# Patient Record
Sex: Female | Born: 1980 | Hispanic: Yes | Marital: Married | Smoking: Never smoker
Health system: Southern US, Community
[De-identification: ages and names within clinical notes are randomized; demographics above are authoritative.]

## PROBLEM LIST (undated history)

## (undated) DIAGNOSIS — R7989 Other specified abnormal findings of blood chemistry: Secondary | ICD-10-CM

## (undated) DIAGNOSIS — O24419 Gestational diabetes mellitus in pregnancy, unspecified control: Secondary | ICD-10-CM

## (undated) DIAGNOSIS — R7309 Other abnormal glucose: Secondary | ICD-10-CM

## (undated) DIAGNOSIS — Z789 Other specified health status: Secondary | ICD-10-CM

## (undated) DIAGNOSIS — O139 Gestational [pregnancy-induced] hypertension without significant proteinuria, unspecified trimester: Secondary | ICD-10-CM

## (undated) DIAGNOSIS — G43109 Migraine with aura, not intractable, without status migrainosus: Secondary | ICD-10-CM

## (undated) DIAGNOSIS — Z8759 Personal history of other complications of pregnancy, childbirth and the puerperium: Secondary | ICD-10-CM

## (undated) DIAGNOSIS — B019 Varicella without complication: Secondary | ICD-10-CM

## (undated) HISTORY — DX: Gestational (pregnancy-induced) hypertension without significant proteinuria, unspecified trimester: O13.9

## (undated) HISTORY — DX: Varicella without complication: B01.9

## (undated) HISTORY — DX: Other specified abnormal findings of blood chemistry: R79.89

## (undated) HISTORY — DX: Other abnormal glucose: R73.09

## (undated) HISTORY — PX: INTRAUTERINE DEVICE INSERTION: SHX323

## (undated) HISTORY — DX: Migraine with aura, not intractable, without status migrainosus: G43.109

## (undated) HISTORY — PX: BREAST ENHANCEMENT SURGERY: SHX7

---

## 2010-04-23 HISTORY — PX: COSMETIC SURGERY: SHX468

## 2010-04-23 HISTORY — PX: AUGMENTATION MAMMAPLASTY: SUR837

## 2013-10-15 LAB — OB RESULTS CONSOLE ABO/RH: RH Type: POSITIVE

## 2013-10-15 LAB — OB RESULTS CONSOLE RUBELLA ANTIBODY, IGM: Rubella: IMMUNE

## 2013-10-15 LAB — OB RESULTS CONSOLE HIV ANTIBODY (ROUTINE TESTING): HIV: NONREACTIVE

## 2013-10-15 LAB — OB RESULTS CONSOLE GC/CHLAMYDIA
Chlamydia: NEGATIVE
Gonorrhea: NEGATIVE

## 2013-10-15 LAB — OB RESULTS CONSOLE RPR: RPR: NONREACTIVE

## 2013-10-15 LAB — OB RESULTS CONSOLE HEPATITIS B SURFACE ANTIGEN: Hepatitis B Surface Ag: NEGATIVE

## 2013-10-15 LAB — OB RESULTS CONSOLE ANTIBODY SCREEN: Antibody Screen: NEGATIVE

## 2014-01-05 ENCOUNTER — Inpatient Hospital Stay (HOSPITAL_COMMUNITY): Admission: AD | Admit: 2014-01-05 | Payer: Self-pay | Source: Ambulatory Visit | Admitting: Obstetrics & Gynecology

## 2014-03-10 ENCOUNTER — Encounter: Payer: BC Managed Care – PPO | Attending: Obstetrics & Gynecology

## 2014-03-10 VITALS — Ht 63.5 in | Wt 168.6 lb

## 2014-03-10 DIAGNOSIS — Z713 Dietary counseling and surveillance: Secondary | ICD-10-CM | POA: Insufficient documentation

## 2014-03-10 DIAGNOSIS — O24419 Gestational diabetes mellitus in pregnancy, unspecified control: Secondary | ICD-10-CM

## 2014-03-10 NOTE — Progress Notes (Signed)
  Patient was seen on 03/10/14 for Gestational Diabetes self-management . The following learning objectives were met by the patient :   States the definition of Gestational Diabetes  States why dietary management is important in controlling blood glucose  Describes the effects of carbohydrates on blood glucose levels  Demonstrates ability to create a balanced meal plan  Demonstrates carbohydrate counting   States when to check blood glucose levels  Demonstrates proper blood glucose monitoring techniques  States the effect of stress and exercise on blood glucose levels  States the importance of limiting caffeine and abstaining from alcohol and smoking  Plan:  Aim for 2 Carb Choices per meal (30 grams) +/- 1 either way for breakfast Aim for 3 Carb Choices per meal (45 grams) +/- 1 either way from lunch and dinner Aim for 1-2 Carbs per snack Begin reading food labels for Total Carbohydrate and sugar grams of foods Consider  increasing your activity level by walking daily as tolerated Begin checking BG before breakfast and 2 hours after first bit of breakfast, lunch and dinner after  as directed by MD  Take medication  as directed by MD  Blood glucose monitor given: One Touch Ultra 2 Lot # [id number] X Exp: 08/2014 Blood glucose reading: $RemoveBeforeDE'76mg'mPHxirzHvexHZHU$ /dl  Patient instructed to monitor glucose levels: FBS: 60 - <90 2 hour: <120  Patient received the following handouts:  Nutrition Diabetes and Pregnancy  Carbohydrate Counting List  Meal Planning worksheet  Patient will be seen for follow-up as needed.

## 2014-04-12 LAB — OB RESULTS CONSOLE GBS: STREP GROUP B AG: NEGATIVE

## 2014-04-23 HISTORY — DX: Maternal care for unspecified type scar from previous cesarean delivery: O34.219

## 2014-04-23 NOTE — L&D Delivery Note (Signed)
Operative Delivery Note: At 10:20 PM a healthy female was delivered via VBAC, Spontaneous.  Presentation: vertex; Position: Occiput,; Station: +3.  Verbal consent: obtained from patient.  Risks and benefits discussed in detail.  Risks include, but are not limited to the risks of anesthesia, bleeding, infection, damage to maternal tissues, fetal cephalhematoma.  There is also the risk of inability to effect vaginal delivery of the head, or shoulder dystocia that cannot be resolved by established maneuvers, leading to the need for emergency cesarean section.  Easy application of Bell shape Vacuum.  Tractions in safety zone over 4 UCs.  Presentation at vulva, but poor adherence of vacuum.  Expulsive efforts of mother efficient without vacuum at that point and FHR monitoring with good base line with only mild variable decelerations.  SVD.  No shoulder dystocia.  Cord milking x 2.  Cord clamped and cut promptly.  Neonatal team called promptly.  APGAR: 1, 6, 8; weight 6 lb 11.1 oz (3036 g).   Placenta status: Intact, Spontaneous.   Cord: 3 vessels with the following complications: None.  Cord pH: 7.01  Anesthesia: Epidural  Instruments: Bell shape vacuum Episiotomy: None Lacerations: 2nd degree;Perineal Suture Repair: 2.0 3.0 vicryl Est. Blood Loss (mL):  350   Mom to postpartum.  Baby to Couplet care / Skin to Skin. PEC on MgSO4.  Transfer to AICU.   Dorothey Oetken,MARIE-LYNE 05/07/2014, 10:58 PM

## 2014-05-06 ENCOUNTER — Other Ambulatory Visit: Payer: Self-pay | Admitting: Obstetrics & Gynecology

## 2014-05-07 ENCOUNTER — Inpatient Hospital Stay (HOSPITAL_COMMUNITY): Payer: BLUE CROSS/BLUE SHIELD | Admitting: Anesthesiology

## 2014-05-07 ENCOUNTER — Encounter (HOSPITAL_COMMUNITY): Payer: Self-pay

## 2014-05-07 ENCOUNTER — Inpatient Hospital Stay (HOSPITAL_COMMUNITY)
Admission: AD | Admit: 2014-05-07 | Discharge: 2014-05-10 | DRG: 775 | Disposition: A | Discharge status: Home / Self Care | Payer: BLUE CROSS/BLUE SHIELD | Source: Ambulatory Visit | Attending: Obstetrics & Gynecology | Admitting: Obstetrics & Gynecology

## 2014-05-07 DIAGNOSIS — O133 Gestational [pregnancy-induced] hypertension without significant proteinuria, third trimester: Secondary | ICD-10-CM | POA: Diagnosis present

## 2014-05-07 DIAGNOSIS — O9902 Anemia complicating childbirth: Secondary | ICD-10-CM | POA: Diagnosis present

## 2014-05-07 DIAGNOSIS — Z3A38 38 weeks gestation of pregnancy: Secondary | ICD-10-CM | POA: Diagnosis present

## 2014-05-07 DIAGNOSIS — D509 Iron deficiency anemia, unspecified: Secondary | ICD-10-CM | POA: Diagnosis present

## 2014-05-07 DIAGNOSIS — O34219 Maternal care for unspecified type scar from previous cesarean delivery: Secondary | ICD-10-CM | POA: Diagnosis not present

## 2014-05-07 DIAGNOSIS — D62 Acute posthemorrhagic anemia: Secondary | ICD-10-CM | POA: Diagnosis not present

## 2014-05-07 DIAGNOSIS — Z3483 Encounter for supervision of other normal pregnancy, third trimester: Secondary | ICD-10-CM | POA: Diagnosis present

## 2014-05-07 DIAGNOSIS — R609 Edema, unspecified: Secondary | ICD-10-CM | POA: Diagnosis present

## 2014-05-07 DIAGNOSIS — Z8759 Personal history of other complications of pregnancy, childbirth and the puerperium: Secondary | ICD-10-CM

## 2014-05-07 HISTORY — DX: Gestational diabetes mellitus in pregnancy, unspecified control: O24.419

## 2014-05-07 HISTORY — DX: Personal history of other complications of pregnancy, childbirth and the puerperium: Z87.59

## 2014-05-07 HISTORY — DX: Other specified health status: Z78.9

## 2014-05-07 HISTORY — DX: Gestational (pregnancy-induced) hypertension without significant proteinuria, third trimester: O13.3

## 2014-05-07 LAB — TYPE AND SCREEN
ABO/RH(D): A POS
Antibody Screen: NEGATIVE

## 2014-05-07 LAB — CBC
HCT: 35 % — ABNORMAL LOW (ref 36.0–46.0)
HEMATOCRIT: 34.5 % — AB (ref 36.0–46.0)
Hemoglobin: 11.7 g/dL — ABNORMAL LOW (ref 12.0–15.0)
Hemoglobin: 11.6 g/dL — ABNORMAL LOW (ref 12.0–15.0)
MCH: 31 pg (ref 26.0–34.0)
MCH: 31.1 pg (ref 26.0–34.0)
MCHC: 33.4 g/dL (ref 30.0–36.0)
MCHC: 33.6 g/dL (ref 30.0–36.0)
MCV: 92.8 fL (ref 78.0–100.0)
MCV: 92.5 fL (ref 78.0–100.0)
PLATELETS: 191 10*3/uL (ref 150–400)
PLATELETS: 171 10*3/uL (ref 150–400)
RBC: 3.77 MIL/uL — AB (ref 3.87–5.11)
RBC: 3.73 MIL/uL — ABNORMAL LOW (ref 3.87–5.11)
RDW: 14.3 % (ref 11.5–15.5)
RDW: 14.1 % (ref 11.5–15.5)
WBC: 7.1 10*3/uL (ref 4.0–10.5)
WBC: 14.3 10*3/uL — ABNORMAL HIGH (ref 4.0–10.5)

## 2014-05-07 LAB — COMPREHENSIVE METABOLIC PANEL
ALK PHOS: 133 U/L — AB (ref 39–117)
ALT: 15 U/L (ref 0–35)
ANION GAP: 7 (ref 5–15)
AST: 26 U/L (ref 0–37)
Albumin: 2.6 g/dL — ABNORMAL LOW (ref 3.5–5.2)
BILIRUBIN TOTAL: 0.2 mg/dL — AB (ref 0.3–1.2)
BUN: 10 mg/dL (ref 6–23)
CO2: 21 mmol/L (ref 19–32)
Calcium: 9 mg/dL (ref 8.4–10.5)
Chloride: 108 mEq/L (ref 96–112)
Creatinine, Ser: 0.55 mg/dL (ref 0.50–1.10)
GFR calc Af Amer: >90 mL/min (ref >90)
GFR calc non Af Amer: >90 mL/min (ref >90)
GLUCOSE: 84 mg/dL (ref 70–99)
POTASSIUM: 3.8 mmol/L (ref 3.5–5.1)
Sodium: 136 mmol/L (ref 135–145)
TOTAL PROTEIN: 5.2 g/dL — AB (ref 6.0–8.3)

## 2014-05-07 LAB — LACTATE DEHYDROGENASE: LDH: 174 U/L (ref 94–250)

## 2014-05-07 LAB — PROTEIN / CREATININE RATIO, URINE
CREATININE, URINE: 114 mg/dL
PROTEIN CREATININE RATIO: 1.28 — AB (ref 0.00–0.15)
TOTAL PROTEIN, URINE: 146 mg/dL

## 2014-05-07 LAB — ABO/RH: ABO/RH(D): A POS

## 2014-05-07 MED ORDER — CITRIC ACID-SODIUM CITRATE 334-500 MG/5ML PO SOLN
30.0000 mL | ORAL | Status: DC | PRN
  Administered 2014-05-07: 30 mL via ORAL
  Filled 2014-05-07 (×2): qty 15

## 2014-05-07 MED ORDER — EPHEDRINE 5 MG/ML INJ
10.0000 mg | INTRAVENOUS | Status: DC | PRN
  Filled 2014-05-07: qty 2

## 2014-05-07 MED ORDER — PHENYLEPHRINE 40 MCG/ML (10ML) SYRINGE FOR IV PUSH (FOR BLOOD PRESSURE SUPPORT)
80.0000 ug | PREFILLED_SYRINGE | INTRAVENOUS | Status: DC | PRN
  Filled 2014-05-07: qty 2

## 2014-05-07 MED ORDER — LACTATED RINGERS IV SOLN: 500.0000 mL | INTRAVENOUS | Status: DC | PRN

## 2014-05-07 MED ORDER — TERBUTALINE SULFATE 1 MG/ML IJ SOLN
0.2500 mg | Freq: Once | INTRAMUSCULAR | Status: AC | PRN
Start: 2014-05-07 — End: 2014-05-07

## 2014-05-07 MED ORDER — OXYCODONE-ACETAMINOPHEN 5-325 MG PO TABS: 1.0000 | ORAL_TABLET | ORAL | Status: DC | PRN

## 2014-05-07 MED ORDER — MAGNESIUM SULFATE 40 G IN LACTATED RINGERS - SIMPLE
2.0000 g/h | INTRAVENOUS | Status: DC
  Administered 2014-05-07 – 2014-05-08 (×2): 2 g/h via INTRAVENOUS
  Filled 2014-05-07 (×2): qty 500

## 2014-05-07 MED ORDER — FENTANYL 2.5 MCG/ML BUPIVACAINE 1/10 % EPIDURAL INFUSION (WH - ANES)
INTRAMUSCULAR | Status: AC
  Administered 2014-05-07: 14 mL/h via EPIDURAL
  Filled 2014-05-07: qty 125

## 2014-05-07 MED ORDER — FENTANYL 2.5 MCG/ML BUPIVACAINE 1/10 % EPIDURAL INFUSION (WH - ANES)
14.0000 mL/h | INTRAMUSCULAR | Status: DC | PRN
  Administered 2014-05-07: 14 mL/h via EPIDURAL
  Filled 2014-05-07 (×2): qty 125

## 2014-05-07 MED ORDER — OXYCODONE-ACETAMINOPHEN 5-325 MG PO TABS: 2.0000 | ORAL_TABLET | ORAL | Status: DC | PRN

## 2014-05-07 MED ORDER — ONDANSETRON HCL 4 MG/2ML IJ SOLN: 4.0000 mg | Freq: Four times a day (QID) | INTRAMUSCULAR | Status: DC | PRN

## 2014-05-07 MED ORDER — ACETAMINOPHEN 325 MG PO TABS: 650.0000 mg | ORAL_TABLET | ORAL | Status: DC | PRN

## 2014-05-07 MED ORDER — MAGNESIUM SULFATE BOLUS VIA INFUSION
4.0000 g | Freq: Once | INTRAVENOUS | Status: AC
  Administered 2014-05-07: 4 g via INTRAVENOUS
  Filled 2014-05-07: qty 500

## 2014-05-07 MED ORDER — LACTATED RINGERS IV SOLN: 500.0000 mL | Freq: Once | INTRAVENOUS | Status: DC

## 2014-05-07 MED ORDER — OXYCODONE-ACETAMINOPHEN 5-325 MG PO TABS
2.0000 | ORAL_TABLET | ORAL | Status: DC | PRN
  Filled 2014-05-07: qty 2

## 2014-05-07 MED ORDER — PHENYLEPHRINE 40 MCG/ML (10ML) SYRINGE FOR IV PUSH (FOR BLOOD PRESSURE SUPPORT)
PREFILLED_SYRINGE | INTRAVENOUS | Status: AC
  Filled 2014-05-07: qty 20

## 2014-05-07 MED ORDER — OXYTOCIN BOLUS FROM INFUSION: 500.0000 mL | INTRAVENOUS | Status: DC

## 2014-05-07 MED ORDER — LIDOCAINE HCL (PF) 1 % IJ SOLN
INTRAMUSCULAR | Status: DC | PRN
  Administered 2014-05-07 (×2): 8 mL

## 2014-05-07 MED ORDER — FENTANYL 2.5 MCG/ML BUPIVACAINE 1/10 % EPIDURAL INFUSION (WH - ANES)
INTRAMUSCULAR | Status: DC | PRN
  Administered 2014-05-07: 14 mL/h via EPIDURAL

## 2014-05-07 MED ORDER — LACTATED RINGERS IV SOLN
INTRAVENOUS | Status: DC
  Administered 2014-05-07 – 2014-05-08 (×2): via INTRAVENOUS

## 2014-05-07 MED ORDER — OXYTOCIN 40 UNITS IN LACTATED RINGERS INFUSION - SIMPLE MED
1.0000 m[IU]/min | INTRAVENOUS | Status: DC
  Administered 2014-05-07: 1 m[IU]/min via INTRAVENOUS
  Filled 2014-05-07: qty 1000

## 2014-05-07 MED ORDER — IBUPROFEN 600 MG PO TABS
600.0000 mg | ORAL_TABLET | Freq: Four times a day (QID) | ORAL | Status: DC
  Administered 2014-05-08 – 2014-05-10 (×11): 600 mg via ORAL
  Filled 2014-05-07 (×11): qty 1

## 2014-05-07 MED ORDER — OXYTOCIN 40 UNITS IN LACTATED RINGERS INFUSION - SIMPLE MED: 62.5000 mL/h | INTRAVENOUS | Status: DC

## 2014-05-07 MED ORDER — DIPHENHYDRAMINE HCL 50 MG/ML IJ SOLN: 12.5000 mg | INTRAMUSCULAR | Status: DC | PRN

## 2014-05-07 MED ORDER — LIDOCAINE HCL (PF) 1 % IJ SOLN
30.0000 mL | INTRAMUSCULAR | Status: DC | PRN
  Administered 2014-05-07: 30 mL via SUBCUTANEOUS
  Filled 2014-05-07: qty 30

## 2014-05-07 NOTE — Progress Notes (Signed)
Dr.Lavoie at bedside--AROM performed, pt status post epidural and comfortable--TOLAC consent signed, Mag orders given

## 2014-05-07 NOTE — H&P (Signed)
Christina Black is a 34 y.o. female G3P0010 [id number] presenting for Induction PEC.  [place] in [place]  Op report confirmed LT C/S. GDMA1 well controled on diet. EFW 8+ Lbs per last US.  OB History    Gravida Para Term Preterm AB TAB SAB Ectopic Multiple Living   3 1 0 0 1 0 1 0 0 0      Past Medical History  Diagnosis Date  . Medical history non-contributory   . Gestational diabetes    Past Surgical History  Procedure Laterality Date  . Breast enhancement surgery     Family History: family history is not on file. Social History:  has no tobacco, alcohol, and drug history on file.  Allergies  Allergen Reactions  . Cefazolin Anaphylaxis and Hives    Dilation: 4 Effacement (%): 90 Station: -2 Exam by:: Dr.Jamilee Lafosse  AROM AF clear +++  Blood pressure 145/90, pulse 73, temperature 97.7 F (36.5 C), temperature source Oral, resp. rate 16, height 5\' 7"  (1.702 m), weight 190 lb (86.183 kg), SpO2 100 %. Exam Physical Exam   FHR 140's accelerations present.  Good variability, no deceleration. UCs q3 min increasing on Pitocin.  HPP:  Patient Active Problem List   Diagnosis Date Noted  . Gestational hypertension without significant proteinuria in third trimester 05/07/2014    Prenatal labs: ABO, Rh: --/--/A POS (01/15 1035) Antibody: NEG (01/15 1035) Rubella:  Immune RPR: Nonreactive (06/25 0000)  HBsAg: Negative (06/25 0000)  HIV: Non-reactive (06/25 0000)  Genetic testing:  Ultrascreen neg.  AFP1 neg. US anato: wnl 1 hr GTT/3 hr GTT abnormal.  GDMA1  GBS: Negative (12/21 0000)   Assessment/Plan: 38 5/7 wks with PEC based on PIH/severe Prtnuria.  Labs wnl.  Induction Pito/AROM.  MgSO4.  Expectant management towards vaginal delivery.  TOLAC.  Epidural.     Christina Black,MARIE-LYNE 05/07/2014, 3:28 PM

## 2014-05-07 NOTE — Progress Notes (Signed)
Dr Billy Coastaavon notified of patient admission status with history of CS, lack of TOLAC consent, GDM, increased blood pressures and report of marginal cord insertion. Discussed POC with provider, SVE, FHr tracing, UCs, and need for signature on consent. Orders for pitocin.

## 2014-05-07 NOTE — Anesthesia Preprocedure Evaluation (Addendum)
Anesthesia Evaluation  Patient identified by MRN, date of birth, ID band Patient awake    Reviewed: Allergy & Precautions, H&P , NPO status , Patient's Chart, lab work & pertinent test results  Airway Mallampati: I  TM Distance: >3 FB Neck ROM: full    Dental no notable dental hx.    Pulmonary neg pulmonary ROS,    Pulmonary exam normal       Cardiovascular hypertension, Pt. on medications     Neuro/Psych negative neurological ROS  negative psych ROS   GI/Hepatic negative GI ROS, Neg liver ROS,   Endo/Other  diabetes, Gestational  Renal/GU negative Renal ROS     Musculoskeletal   Abdominal Normal abdominal exam  (+)   Peds  Hematology negative hematology ROS (+)   Anesthesia Other Findings   Reproductive/Obstetrics (+) Pregnancy                            Anesthesia Physical Anesthesia Plan  ASA: III  Anesthesia Plan: Epidural   Post-op Pain Management:    Induction:   Airway Management Planned:   Additional Equipment:   Intra-op Plan:   Post-operative Plan:   Informed Consent: I have reviewed the patients History and Physical, chart, labs and discussed the procedure including the risks, benefits and alternatives for the proposed anesthesia with the patient or authorized representative who has indicated his/her understanding and acceptance.     Plan Discussed with:   Anesthesia Plan Comments:        Anesthesia Quick Evaluation

## 2014-05-07 NOTE — Anesthesia Procedure Notes (Signed)
Epidural Patient location during procedure: OB Start time: 05/07/2014 2:59 PM End time: 05/07/2014 3:03 PM  Preanesthetic Checklist Completed: patient identified, surgical consent, pre-op evaluation, timeout performed, IV checked, risks and benefits discussed and monitors and equipment checked  Epidural Patient position: sitting Prep: site prepped and draped and DuraPrep Patient monitoring: continuous pulse ox and blood pressure Approach: midline Injection technique: LOR air  Needle:  Needle type: Tuohy  Needle gauge: 17 G Needle length: 9 cm and 9 Needle insertion depth: 5 cm cm Catheter type: closed end flexible Catheter size: 19 Gauge Catheter at skin depth: 10 cm Test dose: negative and Other  Assessment Sensory level: T9 Events: blood not aspirated, injection not painful, no injection resistance, negative IV test and no paresthesia

## 2014-05-08 ENCOUNTER — Encounter (HOSPITAL_COMMUNITY): Payer: Self-pay | Admitting: Obstetrics and Gynecology

## 2014-05-08 DIAGNOSIS — Z8759 Personal history of other complications of pregnancy, childbirth and the puerperium: Secondary | ICD-10-CM

## 2014-05-08 HISTORY — DX: Personal history of other complications of pregnancy, childbirth and the puerperium: Z87.59

## 2014-05-08 LAB — CBC
HEMATOCRIT: 29.7 % — AB (ref 36.0–46.0)
Hemoglobin: 10.4 g/dL — ABNORMAL LOW (ref 12.0–15.0)
MCH: 32.1 pg (ref 26.0–34.0)
MCHC: 35 g/dL (ref 30.0–36.0)
MCV: 91.7 fL (ref 78.0–100.0)
Platelets: 155 10*3/uL (ref 150–400)
RBC: 3.24 MIL/uL — AB (ref 3.87–5.11)
RDW: 14.2 % (ref 11.5–15.5)
WBC: 14.3 10*3/uL — AB (ref 4.0–10.5)

## 2014-05-08 LAB — RPR: RPR: NONREACTIVE

## 2014-05-08 LAB — MRSA PCR SCREENING: MRSA by PCR: NEGATIVE

## 2014-05-08 MED ORDER — WITCH HAZEL-GLYCERIN EX PADS: 1.0000 "application " | MEDICATED_PAD | CUTANEOUS | Status: DC | PRN

## 2014-05-08 MED ORDER — SIMETHICONE 80 MG PO CHEW: 80.0000 mg | CHEWABLE_TABLET | ORAL | Status: DC | PRN

## 2014-05-08 MED ORDER — LACTATED RINGERS IV SOLN
INTRAVENOUS | Status: DC
  Administered 2014-05-08: 12:00:00 via INTRAVENOUS

## 2014-05-08 MED ORDER — DIBUCAINE 1 % RE OINT: 1.0000 "application " | TOPICAL_OINTMENT | RECTAL | Status: DC | PRN

## 2014-05-08 MED ORDER — PRENATAL MULTIVITAMIN CH
1.0000 | ORAL_TABLET | Freq: Every day | ORAL | Status: DC
  Administered 2014-05-08 – 2014-05-10 (×3): 1 via ORAL
  Filled 2014-05-08 (×3): qty 1

## 2014-05-08 MED ORDER — LANOLIN HYDROUS EX OINT: TOPICAL_OINTMENT | CUTANEOUS | Status: DC | PRN

## 2014-05-08 MED ORDER — INFLUENZA VAC SPLIT QUAD 0.5 ML IM SUSY
0.5000 mL | PREFILLED_SYRINGE | INTRAMUSCULAR | Status: AC
  Administered 2014-05-10: 0.5 mL via INTRAMUSCULAR
  Filled 2014-05-08 (×2): qty 0.5

## 2014-05-08 MED ORDER — SENNOSIDES-DOCUSATE SODIUM 8.6-50 MG PO TABS
2.0000 | ORAL_TABLET | ORAL | Status: DC
  Administered 2014-05-08 (×2): 2 via ORAL
  Filled 2014-05-08 (×2): qty 2

## 2014-05-08 MED ORDER — TETANUS-DIPHTH-ACELL PERTUSSIS 5-2.5-18.5 LF-MCG/0.5 IM SUSP
0.5000 mL | Freq: Once | INTRAMUSCULAR | Status: DC
  Filled 2014-05-08: qty 0.5

## 2014-05-08 MED ORDER — BENZOCAINE-MENTHOL 20-0.5 % EX AERO
1.0000 "application " | INHALATION_SPRAY | CUTANEOUS | Status: DC | PRN
  Administered 2014-05-10: 1 via TOPICAL
  Filled 2014-05-08 (×2): qty 56

## 2014-05-08 MED ORDER — ONDANSETRON HCL 4 MG PO TABS: 4.0000 mg | ORAL_TABLET | ORAL | Status: DC | PRN

## 2014-05-08 MED ORDER — DIPHENHYDRAMINE HCL 25 MG PO CAPS: 25.0000 mg | ORAL_CAPSULE | Freq: Four times a day (QID) | ORAL | Status: DC | PRN

## 2014-05-08 MED ORDER — ONDANSETRON HCL 4 MG/2ML IJ SOLN: 4.0000 mg | INTRAMUSCULAR | Status: DC | PRN

## 2014-05-08 MED ORDER — FAMOTIDINE 20 MG PO TABS
20.0000 mg | ORAL_TABLET | Freq: Every day | ORAL | Status: DC
  Administered 2014-05-08 – 2014-05-09 (×3): 20 mg via ORAL
  Filled 2014-05-08 (×3): qty 1

## 2014-05-08 MED ORDER — OXYTOCIN 40 UNITS IN LACTATED RINGERS INFUSION - SIMPLE MED: 1.0000 m[IU]/min | INTRAVENOUS | Status: DC

## 2014-05-08 MED ORDER — TERBUTALINE SULFATE 1 MG/ML IJ SOLN
0.2500 mg | Freq: Once | INTRAMUSCULAR | Status: AC | PRN
  Filled 2014-05-08: qty 1

## 2014-05-08 MED ORDER — OXYTOCIN 40 UNITS IN LACTATED RINGERS INFUSION - SIMPLE MED: 62.5000 mL/h | INTRAVENOUS | Status: DC | PRN

## 2014-05-08 MED ORDER — ZOLPIDEM TARTRATE 5 MG PO TABS: 5.0000 mg | ORAL_TABLET | Freq: Every evening | ORAL | Status: DC | PRN

## 2014-05-08 NOTE — Lactation Note (Addendum)
This note was copied from the chart of Christina Arleta Granada. Lactation Consultation Note Experienced BF mother who has a history of breast augmentation.  She reports that Oneita Krasntony is not latching to the right breast.  We attempted to latch him in a cross cradle position and a laid back position but he was not opening his mouth and cueing.  Drops of colostrum were expressed and applied to his lips but this did not increase his interest in eating.  Mom has colostrum and she is easily able to express it so about 2 ml was collected.  Another 1 ml was offered to the baby on the spoon.  He had no interest so it was massaged on to his gingiva.  I demonstrated to parents how to spoon feed him if it became necessary later. He seems uncomfortable when he is moved so he remained skin to skin, asleep on his mother's chest.   He may feed better in a football hold.  Lactation brochure with information on support groups and outpatient services were left at the bedside.  Patient Name: Christina Black ModenaMarcela Graffam WGNFA'OToday's Date: 05/08/2014     Maternal Data    Feeding Feeding Type: Breast Fed Length of feed: 20 min  LATCH Score/Interventions                      Lactation Tools Discussed/Used     Consult Status      Soyla DryerJoseph, Casanova Schurman 05/08/2014, 11:58 AM

## 2014-05-08 NOTE — Anesthesia Postprocedure Evaluation (Signed)
  Anesthesia Post-op Note  Patient: Christina Black  Procedure(s) Performed: * No procedures listed *  Patient Location: ICU  Anesthesia Type:Epidural  Level of Consciousness: awake and alert   Airway and Oxygen Therapy: Patient Spontanous Breathing  Post-op Pain: mild  Post-op Assessment: Post-op Vital signs reviewed, Patient's Cardiovascular Status Stable, Respiratory Function Stable, No signs of Nausea or vomiting, Adequate PO intake, No headache, No residual numbness and No residual motor weakness  Post-op Vital Signs: Reviewed and stable  Last Vitals:  Filed Vitals:   05/08/14 0800  BP: 142/74  Pulse:   Temp: 36.7 C  Resp: 16    Complications: No apparent anesthesia complications

## 2014-05-08 NOTE — Progress Notes (Addendum)
PPD 1 SVD PEC on MgSO4/GDMA1  S:  Pt reports feeling well/ Tolerating po/ Voiding without problems/ No n/v/ Bleeding is light/ Pain controlled  Newborn  Breastfed, glucose stabilizing.   O:  A & O x 3  Filed Vitals:   05/08/14 1552  BP: 129/77  Pulse: 105  Temp: 98.1 F (36.7 C)  Resp: 18    LABS:  Lab Results  Component Value Date   WBC 14.3* 05/08/2014   HGB 10.4* 05/08/2014   HCT 29.7* 05/08/2014   MCV 91.7 05/08/2014   PLT 155 05/08/2014    Diuresis: 2575 cc so far today.  I/O +368 cc x admission, but -730 cc today.    Lungs: clear  Heart: rcr  Abdomen: soft  Perineum: intact  UH: 0/2 firm  Lochia: normal  Extremities: oedema +++ with 2 beats of clonus and DTRs 3/4 bilat.    A/P: PPD # 1/ [id number]  Doing well, PEC with good diuresis and improving BPs.  Continue routine post partum orders and MgSO4 until 24 hrs if good diuresis continues.   Addendum:  Patient seen this am.  Called and discussed current clinical picture with patient's nurse, Idalia NeedlePaige.  No PEC Sxs.  BPs wnl.  Excellent diuresis with an Extra 500 cc at time of removal of Foley to be added to above numbers.  Decision to slowly d/c MgSO4 now.  Will observe off MgSO4.

## 2014-05-09 MED ORDER — POLYSACCHARIDE IRON COMPLEX 150 MG PO CAPS
150.0000 mg | ORAL_CAPSULE | Freq: Every day | ORAL | Status: DC
  Administered 2014-05-09 – 2014-05-10 (×2): 150 mg via ORAL
  Filled 2014-05-09 (×2): qty 1

## 2014-05-09 NOTE — Lactation Note (Signed)
This note was copied from the chart of Christina Black. Lactation Consultation Note  A #24 NS was initiated by the RN.  Mom reports that it is helping BF. [name] is uncomfortable when his head is supported for BF so assisted mom to latch him in a side-lying position.  He latched easily but only suckled for a few minutes and fell asleep.  There was no visible colostrum in the NS but mom reports that there usually is.  I reminded parents that baby needs to eat 8-12 times a day.  I advised post pumping for 10 minutes at least 6 times in 24 hours.  Parents are expecting a breast pump from their insurance company but don't know when it will arrive.  I offered to set them up with a 2 week rental.  Parents will talk about it and call me if they want it.  Mom has a manual pump.  I told them that they should follow-up with lactation as an outpatient.  They plan to make this appointment when [facility] calls them tomorrow. Patient Name: Christina Willis [name]'XToday's Date: 05/09/2014     Maternal Data    Feeding Feeding Type: Breast Fed Length of feed: 8 min  LATCH Score/Interventions Latch: Grasps breast easily, tongue down, lips flanged, rhythmical sucking.  Audible Swallowing: None  Type of Nipple: Everted at rest and after stimulation  Comfort (Breast/Nipple): Soft / non-tender     Hold (Positioning): Assistance needed to correctly position infant at breast and maintain latch.  LATCH Score: 7  Lactation Tools Discussed/Used Tools: Nipple Shields Nipple shield size: 24   Consult Status      Soyla DryerJoseph, Denny Lave 05/09/2014, 12:10 PM

## 2014-05-09 NOTE — Progress Notes (Signed)
PPD #2- SVD  Subjective:   Reports feeling well No HA, visual disturbance, or epigastric pain Tolerating po/ No nausea or vomiting Bleeding is light Pain controlled with Motrin Up ad lib / ambulatory / voiding without problems Newborn: breastfeeding  / Circumcision: not planning   Objective:   VS:  VS:  Filed Vitals:   05/08/14 2200 05/08/14 2345 05/09/14 0400 05/09/14 0811  BP: 147/74 138/83 129/66 116/73  Pulse:  87 81 68  Temp:  98 F (36.7 C) 97.9 F (36.6 C) 97.9 F (36.6 C)  TempSrc:  Oral Oral Oral  Resp:  18 18 18   Height:      Weight:      SpO2:  98% 98% 100%    LABS:  Recent Labs  05/07/14 2320 05/08/14 0433  WBC 14.3* 14.3*  HGB 11.6* 10.4*  PLT 171 155   Blood type: --/--/A POS, A POS (01/15 1035) Rubella: Immune (06/25 0000)   I&O: Intake/Output      01/16 0701 - 01/17 0700 01/17 0701 - 01/18 0700   P.O. 1440 480   I.V. (mL/kg) 1250 (15.1)    Total Intake(mL/kg) 2690 (32.6) 480 (5.8)   Urine (mL/kg/hr) 4425 (2.2) 850 (3.5)   Emesis/NG output     Stool 1 (0)    Blood     Total Output 4426 850   Net -1736 -370          Physical Exam: Alert and oriented x3 Heart: RRR Lungs: CTA bilat Abdomen: soft, non-tender, non-distended  Fundus: firm, non-tender, U-2 Perineum: Well approximated, no significant erythema, edema, or drainage; healing well. Lochia: small Extremities: 2+ BLE edema, no calf pain or tenderness    Assessment:  PPD #2 G3P2012/ S/P:VAVD, 2nd degree laceration PEC, delivered-stable  Dependent edema Mild anemia Doing well    Plan: Good diuresis, BP improving, Mag off since last night Continue strict i/o until d/c PIH labs in am Continue routine post partum orders Anticipate D/C home tomorrow   Donette LarryBHAMBRI, Leopold Smyers, N MSN, CNM 05/09/2014, 10:00 AM

## 2014-05-10 DIAGNOSIS — D509 Iron deficiency anemia, unspecified: Secondary | ICD-10-CM | POA: Diagnosis present

## 2014-05-10 DIAGNOSIS — O34219 Maternal care for unspecified type scar from previous cesarean delivery: Secondary | ICD-10-CM | POA: Insufficient documentation

## 2014-05-10 DIAGNOSIS — R609 Edema, unspecified: Secondary | ICD-10-CM

## 2014-05-10 HISTORY — DX: Edema, unspecified: R60.9

## 2014-05-10 LAB — CBC
HEMATOCRIT: 26.9 % — AB (ref 36.0–46.0)
Hemoglobin: 9.1 g/dL — ABNORMAL LOW (ref 12.0–15.0)
MCH: 32 pg (ref 26.0–34.0)
MCHC: 33.8 g/dL (ref 30.0–36.0)
MCV: 94.7 fL (ref 78.0–100.0)
PLATELETS: 151 10*3/uL (ref 150–400)
RBC: 2.84 MIL/uL — AB (ref 3.87–5.11)
RDW: 14.6 % (ref 11.5–15.5)
WBC: 8.8 10*3/uL (ref 4.0–10.5)

## 2014-05-10 LAB — COMPREHENSIVE METABOLIC PANEL
ALK PHOS: 85 U/L (ref 39–117)
ALT: 16 U/L (ref 0–35)
AST: 25 U/L (ref 0–37)
Albumin: 2.2 g/dL — ABNORMAL LOW (ref 3.5–5.2)
Anion gap: 2 — ABNORMAL LOW (ref 5–15)
BILIRUBIN TOTAL: 0.3 mg/dL (ref 0.3–1.2)
BUN: 8 mg/dL (ref 6–23)
CHLORIDE: 108 meq/L (ref 96–112)
CO2: 28 mmol/L (ref 19–32)
Calcium: 8.2 mg/dL — ABNORMAL LOW (ref 8.4–10.5)
Creatinine, Ser: 0.59 mg/dL (ref 0.50–1.10)
GFR calc Af Amer: >90 mL/min (ref >90)
GLUCOSE: 77 mg/dL (ref 70–99)
POTASSIUM: 4.3 mmol/L (ref 3.5–5.1)
Sodium: 138 mmol/L (ref 135–145)
TOTAL PROTEIN: 4.4 g/dL — AB (ref 6.0–8.3)

## 2014-05-10 MED ORDER — IBUPROFEN 600 MG PO TABS: 600.0000 mg | ORAL_TABLET | Freq: Four times a day (QID) | ORAL | Status: DC

## 2014-05-10 MED ORDER — OXYCODONE-ACETAMINOPHEN 5-325 MG PO TABS: 1.0000 | ORAL_TABLET | ORAL | Status: DC | PRN

## 2014-05-10 MED ORDER — MAGNESIUM 200 MG PO TABS: 200.0000 mg | ORAL_TABLET | Freq: Every day | ORAL | Status: DC

## 2014-05-10 MED ORDER — POLYSACCHARIDE IRON COMPLEX 150 MG PO CAPS: 150.0000 mg | ORAL_CAPSULE | Freq: Every day | ORAL | Status: DC

## 2014-05-10 MED ORDER — HYDROCHLOROTHIAZIDE 12.5 MG PO CAPS: 12.5000 mg | ORAL_CAPSULE | Freq: Every day | ORAL | Status: DC

## 2014-05-10 NOTE — Lactation Note (Signed)
This note was copied from the chart of Christina Charmine Hoopingarner. Lactation Consultation Note: Follow up visit with mom. She reports baby is nursing well with NS. Baby fussy after Dr. Ewell PoeAnderson's visit. Attempted to latch without NS but baby too fussy. Nursed for 15 min. Mom reports breasts are feeling heavy and warm today. Able to hand express transitional milk. 2 week symphony rental completed. No questions at present. To see Lesle ReekBarb Carder tomorrow   Patient Name: Christina Black ZOXWR'UToday's Date: 05/10/2014 Reason for consult: Follow-up assessment   Maternal Data Formula Feeding for Exclusion: No Has patient been taught Hand Expression?: Yes Does the patient have breastfeeding experience prior to this delivery?: Yes  Feeding Feeding Type: Breast Fed Length of feed: 15 min  LATCH Score/Interventions Latch: Grasps breast easily, tongue down, lips flanged, rhythmical sucking.  Audible Swallowing: A few with stimulation  Type of Nipple: Everted at rest and after stimulation  Comfort (Breast/Nipple): Soft / non-tender     Hold (Positioning): Assistance needed to correctly position infant at breast and maintain latch. Intervention(s): Support Pillows  LATCH Score: 8  Lactation Tools Discussed/Used Nipple shield size: 24 Pump Review: Setup, frequency, and cleaning;Milk Storage   Consult Status Consult Status: Complete    Pamelia HoitWeeks, Salimata Christenson D 05/10/2014, 8:30 AM

## 2014-05-10 NOTE — Progress Notes (Signed)
PPD 3 SVD/w/ VAC assist / PEC  S:  Reports feeling well - occasional mild headaches that resolve with medication             Tolerating po/ No nausea or vomiting             Bleeding is light             Pain controlled with motrin and percocet             Up ad lib / ambulatory / voiding QS  Newborn breast feeding  O:               VS: BP 143/79 mmHg  Pulse 64  Temp(Src) 98.4 F (36.9 C) (Oral)  Resp 18  Ht 5\' 7"  (1.702 m)  Wt 82.555 kg (182 lb)  BMI 28.50 kg/m2  SpO2 98%  Breastfeeding? Unknown   LABS: PIH labs stable             Recent Labs  05/08/14 0433 05/10/14 0620  WBC 14.3* 8.8  HGB 10.4* 9.1*  PLT 155 151               Blood type: --/--/A POS, A POS (01/15 1035)  Rubella: Immune (06/25 0000)                     I&O: Intake/Output      01/17 0701 - 01/18 0700 01/18 0701 - 01/19 0700   P.O. 1920    I.V. (mL/kg)     Total Intake(mL/kg) 1920 (23.3)    Urine (mL/kg/hr) 3800 (1.9)    Stool     Total Output 3800     Net -1880                        Physical Exam:             Alert and oriented X3  Lungs: Clear and unlabored  Heart: regular rate and rhythm / no mumurs  Abdomen: soft, non-tender, non-distended              Fundus: firm, non-tender, U-1  Perineum: no edema   Lochia: small  Extremities: 3+ pretibial to pedal / shiny skin appearance, no calf pain or tenderness    A: PPD # 3 VAc assist vaginal delivery / PEC             IDA anemia             Dependent edema   Doing well - stable status  P: Routine post partum orders  DC home              PEC precautions reviewed - call if severe headache or unresponsive to analgesia              OV next week for BP check and edema recheck               Start HCTZ today x 7 days  Marlinda MikeBAILEY, Sheryle Vice CNM, MSN, [facility] 05/10/2014, 10:21 AM

## 2014-05-10 NOTE — Discharge Summary (Signed)
Obstetric Discharge Summary  Reason for Admission: induction of labor / previous cesarean section - desires VBAC / Pre-eclampsia (PEC) Prenatal Procedures: ultrasound and PIH labs Intrapartum Procedures: spontaneous vaginal delivery, vacuum and TOLAC Postpartum Procedures: flu vaccine Complications-Operative and Postpartum: 2nd degree perineal laceration and PP magnesium sulfate x 24 hours for PEC and IDA compounded with ABL HEMOGLOBIN  Date Value Ref Range Status  05/10/2014 9.1* 12.0 - 15.0 g/dL Final   HCT  Date Value Ref Range Status  05/10/2014 26.9* 36.0 - 46.0 % Final    Physical Exam:  General: alert, cooperative and no distress Lochia: appropriate Uterine Fundus: firm Incision: healing well DVT Evaluation: No evidence of DVT seen on physical exam.  Discharge Diagnoses: Term Pregnancy-delivered, Preelampsia and Successful TOLAC - VBAC, IDA of pregnancy with compounded ABL anemia / dependent edema  Discharge Information: Date: 05/10/2014 Activity: pelvic rest  With legs elevated Diet: routine with increased water intake Medications: PNV, Ibuprofen, Iron, Percocet and magnesium / HCTZ 12.5 xx 10 days Condition: stable Instructions: refer to practice specific booklet Discharge to: home Follow-up Information    Follow up with LAVOIE,MARIE-LYNE, MD. Schedule an appointment as soon as possible for a visit in 1 week.   Specialty:  Obstetrics and Gynecology   Why:  BP recheck and swelling recheck   Contact information:   [address] [place] [id number] [phone]       Newborn Data: Live born female  Birth Weight: 6 lb 11.1 oz (3036 g) APGAR: 1, 6  Home with mother.  Christina Black, Christina Black 05/10/2014, 10:29 AM

## 2014-10-22 DIAGNOSIS — N393 Stress incontinence (female) (male): Secondary | ICD-10-CM | POA: Insufficient documentation

## 2015-04-24 DIAGNOSIS — R7309 Other abnormal glucose: Secondary | ICD-10-CM

## 2015-04-24 HISTORY — DX: Other abnormal glucose: R73.09

## 2015-07-21 ENCOUNTER — Encounter: Payer: Self-pay | Admitting: Obstetrics and Gynecology

## 2015-07-21 ENCOUNTER — Ambulatory Visit (INDEPENDENT_AMBULATORY_CARE_PROVIDER_SITE_OTHER): Payer: BLUE CROSS/BLUE SHIELD | Admitting: Obstetrics and Gynecology

## 2015-07-21 VITALS — BP 100/70 | HR 80 | Resp 16 | Ht 65.0 in | Wt 144.0 lb

## 2015-07-21 DIAGNOSIS — Z01419 Encounter for gynecological examination (general) (routine) without abnormal findings: Secondary | ICD-10-CM | POA: Diagnosis not present

## 2015-07-21 DIAGNOSIS — Z8632 Personal history of gestational diabetes: Secondary | ICD-10-CM | POA: Diagnosis not present

## 2015-07-21 DIAGNOSIS — R635 Abnormal weight gain: Secondary | ICD-10-CM

## 2015-07-21 DIAGNOSIS — R234 Changes in skin texture: Secondary | ICD-10-CM | POA: Diagnosis not present

## 2015-07-21 DIAGNOSIS — N393 Stress incontinence (female) (male): Secondary | ICD-10-CM

## 2015-07-21 NOTE — Progress Notes (Signed)
Patient is scheduled for Bilateral 3D Breast Diagnostic Mammogram and Bilateral Breast Ultrasounds at [facility] on 07/29/15 at 0810 . Patient agreeable to time/date/location. Written appointment information given.

## 2015-07-21 NOTE — Progress Notes (Signed)
Patient ID: Christina Black, female   DOB: 06-23-1980, 35 y.o.   MRN: [id number] 35 y.o. G82P2012 Married Caucasian/[place] female here for annual exam.    First delivery was Cesarean and second delivery was normal.  Pushed for 2.5 hours.  Developed urgency of urination following this.  Saw urologist - Dr. Beverely Pace and did evaluation.  Had urodynamic testing.  Did not take medication for overactive bladder due to breast feeding status.  Urgency improved but is having urinary incontinence with exercise or cough. Using pads daily.   Declines future childbearing.   Has Mirena IUD.  Irregular menses for 10 months.  Now has rare spotting.   Gaining weight.  Finished breast feeding.  Had breast implants.  Feels the shape of the breast is different.   2 children - 2 boys.  Husband works for [employer].   PCP:   R. Aguiar   No LMP recorded. Patient is not currently having periods (Reason: IUD).     Period Cycle (Days):  (no cycles with Mirena IUD)     Sexually active: Yes.  female  The current method of family planning is IUD--Mirena inserted 06/2014     Smoker:  no  Health Maintenance: Pap:  07/2014 normal per patient  History of abnormal Pap:  no MMG:  2012 normal in [place]--prior to breast augmentation Colonoscopy:  n/a BMD:   n/a  Result  n/a TDaP:  2016 Gardasil:   Unsure HIV: negative in pregnancy.  Hep C: not candidate.  Screening Labs:  Hb today: 10/2014,    PCP does labs.   reports that she has never smoked. She does not have any smokeless tobacco history on file. She reports that she does not drink alcohol or use illicit drugs.  Past Medical History  Diagnosis Date  . Medical history non-contributory   . Gestational diabetes   . Status post vacuum-assisted vaginal delivery (1/15) 05/08/2014  . Gestational diabetes   . Pregnancy induced hypertension     Past Surgical History  Procedure Laterality Date  . Breast enhancement surgery    . Augmentation mammaplasty   2012    Silicone in [place]  . Cesarean section  01-29-09    in [place]  . Cosmetic surgery  2012    liposuction--[place]    Current Outpatient Prescriptions  Medication Sig Dispense Refill  . levonorgestrel (MIRENA) 20 MCG/24HR IUD by Intrauterine route.     No current facility-administered medications for this visit.    Family History  Problem Relation Age of Onset  . Cancer Father 1    Dec--metastatic CA lung  . Hypertension Father   . Hypertension Mother   . Cancer Maternal Grandfather     Dec--pancreatic CA    ROS:  Pertinent items are noted in HPI.  Otherwise, a comprehensive ROS was negative.  Exam:   BP 100/70 mmHg  Pulse 80  Resp 16  Ht  (1.651 m)  Wt 144 lb (65.318 kg)  BMI 23.96 kg/m2  LMP   Breastfeeding? No    General appearance: alert, cooperative and appears stated age Head: Normocephalic, without obvious abnormality, atraumatic Neck: no adenopathy, supple, symmetrical, trachea midline and thyroid normal to inspection and palpation Lungs: clear to auscultation bilaterally Breasts: No nipple retraction or dimpling, No nipple discharge or bleeding, No axillary or supraclavicular adenopathy, irregular breast shape when lying down, normal when sitting up.  Implants palpable with no dominant masses. Heart: regular rate and rhythm Abdomen: incisions:  Yes.     Pfannenstiel ,  soft, non-tender; no masses, no organomegaly Extremities: extremities normal, atraumatic, no cyanosis or edema Skin: Skin color, texture, turgor normal. No rashes or lesions Lymph nodes: Cervical, supraclavicular, and axillary nodes normal. No abnormal inguinal nodes palpated Neurologic: Grossly normal  Pelvic: External genitalia:  no lesions              Urethra:  normal appearing urethra with no masses, tenderness or lesions              Bartholins and Skenes: normal                 Vagina: normal appearing vagina with normal color and discharge, no lesions              Cervix: no  lesions and IUD strings seen.              Pap taken: No. Bimanual Exam:  Uterus:  normal size, contour, position, consistency, mobility, non-tender              Adnexa: normal adnexa and no mass, fullness, tenderness              Rectal exam: No..  Confirms.              Anus:  normal sphincter tone, no lesions  Chaperone was present for exam.  Assessment:   Well woman visit with normal exam. Mirena IUD patient.  Breast shape irregularity.  I believe this is due to breast feeding and her implants.  The breast size and shape has changed. Weight gain.  Hx GDM. Mixed incontinence.  Now more stress incontinence than overactive bladder.  Plan: Ok for bilateral diagnostic mammogram and ultrasound if needed to rule out implant issue. Recommended self breast exam.  Pap and HR HPV as above. Discussed Calcium, Vitamin D, regular exercise program including cardiovascular and weight bearing exercise. Labs performed.  No.  See orders for future labs including HgbA1C, CMP, lipids, CBC, TSH. Prescription medication(s) given.  No..  See orders. I have recommended a follow up visit in 4 weeks to review Dr. Ledell NossAlbertson's notes and discuss urinary incontinence in further detail.  We will request records. Follow up annually and prn.   Additional counseling given.  Yes.  . __10_____ minutes face to face time of which over 50% was spent in counseling regarding mixed incontinence, breast changes, and weight gain.  I did discuss physical therapy and midurethral sling for stress incontinence and medical therapy for overactive bladder. I clarified that medication for overactive bladder does not treat stress incontinence. ACOG handout on urinary incontinence and surgical care for urinary incontinence.   After visit summary provided.

## 2015-07-21 NOTE — Patient Instructions (Signed)

## 2015-07-29 ENCOUNTER — Ambulatory Visit
Admission: RE | Admit: 2015-07-29 | Discharge: 2015-07-29 | Disposition: A | Discharge status: Home / Self Care | Payer: BLUE CROSS/BLUE SHIELD | Source: Ambulatory Visit | Attending: Obstetrics and Gynecology | Admitting: Obstetrics and Gynecology

## 2015-07-29 DIAGNOSIS — R234 Changes in skin texture: Secondary | ICD-10-CM

## 2015-08-16 ENCOUNTER — Other Ambulatory Visit (INDEPENDENT_AMBULATORY_CARE_PROVIDER_SITE_OTHER): Payer: BLUE CROSS/BLUE SHIELD

## 2015-08-16 DIAGNOSIS — Z01419 Encounter for gynecological examination (general) (routine) without abnormal findings: Secondary | ICD-10-CM

## 2015-08-16 DIAGNOSIS — R635 Abnormal weight gain: Secondary | ICD-10-CM

## 2015-08-16 DIAGNOSIS — Z8632 Personal history of gestational diabetes: Secondary | ICD-10-CM

## 2015-08-16 LAB — COMPREHENSIVE METABOLIC PANEL
ALT: 13 U/L (ref 6–29)
AST: 15 U/L (ref 10–30)
Albumin: 4.1 g/dL (ref 3.6–5.1)
Alkaline Phosphatase: 54 U/L (ref 33–115)
BILIRUBIN TOTAL: 0.4 mg/dL (ref 0.2–1.2)
BUN: 13 mg/dL (ref 7–25)
CHLORIDE: 104 mmol/L (ref 98–110)
CO2: 29 mmol/L (ref 20–31)
CREATININE: 0.79 mg/dL (ref 0.50–1.10)
Calcium: 9.4 mg/dL (ref 8.6–10.2)
Glucose, Bld: 91 mg/dL (ref 65–99)
Potassium: 4.3 mmol/L (ref 3.5–5.3)
Sodium: 139 mmol/L (ref 135–146)
TOTAL PROTEIN: 6.4 g/dL (ref 6.1–8.1)

## 2015-08-16 LAB — CBC
HCT: 37.1 % (ref 35.0–45.0)
HEMOGLOBIN: 12.3 g/dL (ref 11.7–15.5)
MCH: 29.4 pg (ref 27.0–33.0)
MCHC: 33.2 g/dL (ref 32.0–36.0)
MCV: 88.5 fL (ref 80.0–100.0)
MPV: 10 fL (ref 7.5–12.5)
Platelets: 296 10*3/uL (ref 140–400)
RBC: 4.19 MIL/uL (ref 3.80–5.10)
RDW: 13.5 % (ref 11.0–15.0)
WBC: 6.1 10*3/uL (ref 3.8–10.8)

## 2015-08-16 LAB — LIPID PANEL
CHOLESTEROL: 179 mg/dL (ref 125–200)
HDL: 54 mg/dL (ref >=46)
LDL Cholesterol: 108 mg/dL (ref <130)
Total CHOL/HDL Ratio: 3.3 Ratio (ref <=5.0)
Triglycerides: 86 mg/dL (ref <150)
VLDL: 17 mg/dL (ref <30)

## 2015-08-16 LAB — HEMOGLOBIN A1C
HEMOGLOBIN A1C: 5.7 % — AB (ref <5.7)
MEAN PLASMA GLUCOSE: 117 mg/dL

## 2015-08-16 LAB — TSH: TSH: 1.03 m[IU]/L

## 2015-08-24 ENCOUNTER — Ambulatory Visit: Payer: BLUE CROSS/BLUE SHIELD | Admitting: Obstetrics and Gynecology

## 2015-08-25 ENCOUNTER — Telehealth: Payer: Self-pay | Admitting: Obstetrics and Gynecology

## 2015-08-25 NOTE — Telephone Encounter (Signed)
Left message on voicemail to call and reschedule cancelled appointment for Friday.

## 2015-08-26 ENCOUNTER — Ambulatory Visit: Payer: BLUE CROSS/BLUE SHIELD | Admitting: Obstetrics and Gynecology

## 2015-08-27 NOTE — Telephone Encounter (Signed)
Thank you for the update!

## 2015-08-28 NOTE — Telephone Encounter (Signed)
Call to patient, left message to call back to reschedule appointment.

## 2015-08-29 NOTE — Telephone Encounter (Signed)
Please call back to patient.

## 2015-09-01 NOTE — Telephone Encounter (Signed)
-----   Message from Patton SallesBrook E Amundson C Silva, MD sent at 08/27/2015  4:36 AM EDT ----- Please contact patient with test results.  We was to come in for an appointment on 5/5, but this is being rescheduled.   Her hemoglobin A1C is 5.7, which is in a prediabetic range.  She will need to follow a low sugar and low carbohydrate diet, exercise, and do a follow up with her PCP.  The cholesterol, blood chemistries, blood counts, and thyroid are all normal.   I will see her for her follow up appointment to discuss her bladder control.  Cc - Claudette LawsAmanda Dixon

## 2015-09-01 NOTE — Telephone Encounter (Signed)
Call to patient with results. She is given results and verbalized understanding.  Patient has follow up appointment with Dr. Edward JollySilva scheduled for 09/02/15. She will follow up as scheduled.  Routing to provider for final review. Patient agreeable to disposition. Will close encounter.

## 2015-09-02 ENCOUNTER — Encounter: Payer: Self-pay | Admitting: Obstetrics and Gynecology

## 2015-09-02 ENCOUNTER — Ambulatory Visit (INDEPENDENT_AMBULATORY_CARE_PROVIDER_SITE_OTHER): Payer: BLUE CROSS/BLUE SHIELD | Admitting: Obstetrics and Gynecology

## 2015-09-02 VITALS — BP 100/64 | HR 60 | Ht 65.0 in | Wt 146.8 lb

## 2015-09-02 DIAGNOSIS — N393 Stress incontinence (female) (male): Secondary | ICD-10-CM | POA: Diagnosis not present

## 2015-09-02 DIAGNOSIS — R7309 Other abnormal glucose: Secondary | ICD-10-CM

## 2015-09-02 NOTE — Progress Notes (Signed)
Patient ID: Christina Black, female   DOB: 12-03-1980, 35 y.o.   MRN: [id number] GYNECOLOGY  VISIT   HPI: 35 y.o.   Married  Caucasian/[place]ian female   [id number] with No LMP recorded. Patient is not currently having periods (Reason: IUD).   here for follow up of incontinence and review of her labs.   Has elevated HgbA1C and hx of gestational diabetes.  Leaks urine with exercise or cough.  Using a pad.  No real problems with urgency now. No leak for no reason.   No prior physical therapy.   Patient struggled with urinary incontinence following delivery of her last child.  Records received from Dr. Ledell Noss office.  Patient had urodynamic testing in October 2016 showing overactive bladder.  Received Rx for Myrbetriq but did not take due to breast feeding.   No constipation or fecal incontinence.  No known prolapse.   GYNECOLOGIC HISTORY: No LMP recorded. Patient is not currently having periods (Reason: IUD). Contraception:  Mirena IUD inserted 06/2014 Menopausal hormone therapy:  n/a Last mammogram: 07-29-15 Diag.Bil.w/implants/no rupture of implant/Neg/screening age 11:[facility] Last pap smear:   07/2014 normal per patient        OB History    Gravida Para Term Preterm AB TAB SAB Ectopic Multiple Living   0 1 0 1 0 0 2         Patient Active Problem List   Diagnosis Date Noted  . VBAC, delivered, current hospitalization 05/10/2014  . IDA (iron deficiency anemia) 05/10/2014  . Dependent edema 05/10/2014  . Postpartum state 05/08/2014  . Status post vacuum-assisted vaginal delivery (1/15) 05/08/2014  . Gestational hypertension without significant proteinuria in third trimester 05/07/2014    Past Medical History  Diagnosis Date  . Medical history non-contributory   . Gestational diabetes   . Status post vacuum-assisted vaginal delivery (1/15) 05/08/2014  . Gestational diabetes   . Pregnancy induced hypertension   . Elevated hemoglobin A1c 2017    Level  - 5.7    Past Surgical History  Procedure Laterality Date  . Breast enhancement surgery    . Augmentation mammaplasty  2012    Silicone in [place]  . Cesarean section  01-29-09    in [place]  . Cosmetic surgery  2012    liposuction--[place]    Current Outpatient Prescriptions  Medication Sig Dispense Refill  . levonorgestrel (MIRENA) 20 MCG/24HR IUD by Intrauterine route.     No current facility-administered medications for this visit.     ALLERGIES: Cefazolin  Family History  Problem Relation Age of Onset  . Cancer Father 22    Dec--metastatic CA lung  . Hypertension Father   . Hypertension Mother   . Cancer Maternal Grandfather     Dec--pancreatic CA    Social History   Social History  . Marital Status: Married    Spouse Name: N/A  . Number of Children: N/A  . Years of Education: N/A   Occupational History  . Not on file.   Social History Main Topics  . Smoking status: Never Smoker   . Smokeless tobacco: Not on file  . Alcohol Use: No  . Drug Use: No  . Sexual Activity:    Partners: Male    Birth Control/ Protection: IUD     Comment: Mirena inserted 06/2014   Other Topics Concern  . Not on file   Social History Narrative    ROS:  Pertinent items are noted in HPI.  PHYSICAL EXAMINATION:  BP 100/64 mmHg  Pulse 60  Ht 5\' 5"  (1.651 m)  Wt 146 lb 12.8 oz (66.588 kg)  BMI 24.43 kg/m2    General appearance: alert, cooperative and appears stated age   Pelvic: External genitalia:  no lesions              Urethra:  normal appearing urethra with no masses, tenderness or lesions              Bartholins and Skenes: normal                 Vagina: normal appearing vagina with normal color and discharge, no lesions              Cervix: no lesions.  IUD strings present.        Bimanual Exam:  Uterus:  normal size, contour, position, consistency, mobility, non-tender              Adnexa: normal adnexa and no mass, fullness, tenderness              Rectal  exam: Yes.  .  Confirms.              Anus:  normal sphincter tone, no lesions  Chaperone was present for exam.  LABS  HgbA1C - 5.7 on 08/16/15.  ASSESSMENT  Genuine stress incontinence.  Overactive bladder has resolved. Prediabetic and hx of gestational diabetes.  Mirena IUD patient.     PLAN  Discussion of genuine stress incontinence - etiologies and tx options - physical therapy, Cymbalta, and midurethral sling.  We discussed midurethral sling in detail and the permanent mesh materials which are currently the most widely used for this procedure.  I discussed slings being 85 - 90% effective in treatment of stress incontinence.  I discussed specific risks of slings including but not limited to erosions and exposure, dyspareunia, cystotomy, urinary retention and slower voiding, increase in urgency symptoms, need for prolonged catheterization, urinary tract infections, bleeding, infection, and damage to surrounding organs. I recommend urodynamic testing if patient wishes to proceed with surgical care.  Patient would like to proceed with physical therapy, and [facility]  Patient will follow a low carbohydrate diet as she did during pregnancy. I recommend yearly hemoglobin A1C testing.  Follow up prn.    An After Visit Summary was printed and given to the patient.  ___25___ minutes face to face time of which over 50% was spent in counseling.

## 2015-09-05 ENCOUNTER — Encounter: Payer: Self-pay | Admitting: Obstetrics and Gynecology

## 2015-09-22 ENCOUNTER — Telehealth: Payer: Self-pay | Admitting: Emergency Medicine

## 2015-09-22 NOTE — Telephone Encounter (Signed)
-----   Message from Patton SallesBrook E Amundson C Silva, MD sent at 09/14/2015  7:41 PM EDT ----- Regarding: RE: Mammogram hold  Ok to remove from mammogram hold.   Thanks,   Brook ----- Message -----    From: Joeseph Amorracy L Kymorah Korf, RN    Sent: 09/12/2015   3:40 PM      To: Brook Rosalin HawkingE Amundson C Silva, MD Subject: Mammogram hold                                 Dr. Edward JollySilva,  This patient was in mammogram hold per Dr. Oscar LaJertson until follow up appointment with you. Okay to remove from hold?

## 2015-09-22 NOTE — Telephone Encounter (Signed)
Out of hold per Dr. Silva   

## 2016-11-13 DIAGNOSIS — M654 Radial styloid tenosynovitis [de Quervain]: Secondary | ICD-10-CM | POA: Insufficient documentation

## 2016-11-13 DIAGNOSIS — J302 Other seasonal allergic rhinitis: Secondary | ICD-10-CM | POA: Insufficient documentation

## 2016-11-13 HISTORY — DX: Radial styloid tenosynovitis (de quervain): M65.4

## 2016-11-29 ENCOUNTER — Encounter: Payer: Self-pay | Admitting: Allergy and Immunology

## 2016-11-29 ENCOUNTER — Ambulatory Visit (INDEPENDENT_AMBULATORY_CARE_PROVIDER_SITE_OTHER): Payer: BLUE CROSS/BLUE SHIELD | Admitting: Allergy and Immunology

## 2016-11-29 VITALS — BP 98/62 | HR 79 | Temp 98.2°F | Resp 16 | Ht 65.5 in | Wt 153.0 lb

## 2016-11-29 DIAGNOSIS — Z87898 Personal history of other specified conditions: Secondary | ICD-10-CM | POA: Diagnosis not present

## 2016-11-29 DIAGNOSIS — H1010 Acute atopic conjunctivitis, unspecified eye: Secondary | ICD-10-CM | POA: Insufficient documentation

## 2016-11-29 DIAGNOSIS — Z91018 Allergy to other foods: Secondary | ICD-10-CM | POA: Diagnosis not present

## 2016-11-29 DIAGNOSIS — J3089 Other allergic rhinitis: Secondary | ICD-10-CM | POA: Insufficient documentation

## 2016-11-29 DIAGNOSIS — H1013 Acute atopic conjunctivitis, bilateral: Secondary | ICD-10-CM | POA: Diagnosis not present

## 2016-11-29 MED ORDER — OLOPATADINE HCL 0.7 % OP SOLN: 1.0000 [drp] | OPHTHALMIC | 5 refills | Status: DC

## 2016-11-29 MED ORDER — MOMETASONE FUROATE 50 MCG/ACT NA SUSP: 2.0000 | Freq: Every day | NASAL | 5 refills | Status: DC

## 2016-11-29 MED ORDER — LEVOCETIRIZINE DIHYDROCHLORIDE 5 MG PO TABS: 5.0000 mg | ORAL_TABLET | Freq: Every evening | ORAL | 5 refills | Status: DC

## 2016-11-29 NOTE — Patient Instructions (Addendum)
Seasonal allergic rhinitis  Aeroallergen avoidance measures have been discussed and provided in written form.  A prescription has been provided for levocetirizine, 5mg  daily as needed.  A prescription has been provided for Nasonex nasal spray, one spray per nostril 1-2 times daily as needed. Proper nasal spray technique has been discussed and demonstrated.  I have also recommended nasal saline spray (i.e., Simply Saline) or nasal saline lavage (i.e., NeilMed) as needed and prior to medicated nasal sprays.  If allergen avoidance measures and medications fail to adequately relieve symptoms, aeroallergen immunotherapy will be considered.  Allergic conjunctivitis  Treatment plan as outlined above for allergic rhinitis.  A prescription has been provided for Pazeo, one drop per eye daily as needed.  I have also recommended eye lubricant drops (i.e., Natural Tears) as needed.  History of food allergy The patient's history suggests food allergy to peppers.  We do not have a commercial food allergen for peppers, therefore we will send for lab work.  A laboratory order form has been provided for serum specific IgE against peppers (jalapeno, Bell, red).  Until this allergy has been ruled out, she is to carefully avoid these foods.  History of wheezing Quiescent.  Unless lower respiratory symptoms arise, we will not treat or evaluate further.   Return in about 4 months (around 03/31/2017), or if symptoms worsen or fail to improve.  Reducing Pollen Exposure  The American Academy of Allergy, Asthma and Immunology suggests the following steps to reduce your exposure to pollen during allergy seasons.    1. Do not hang sheets or clothing out to dry; pollen may collect on these items. 2. Do not mow lawns or spend time around freshly cut grass; mowing stirs up pollen. 3. Keep windows closed at night.  Keep car windows closed while driving. 4. Minimize morning activities outdoors, a time when  pollen counts are usually at their highest. 5. Stay indoors as much as possible when pollen counts or humidity is high and on windy days when pollen tends to remain in the air longer. 6. Use air conditioning when possible.  Many air conditioners have filters that trap the pollen spores. 7. Use a HEPA room air filter to remove pollen form the indoor air you breathe.

## 2016-11-29 NOTE — Assessment & Plan Note (Signed)
   Aeroallergen avoidance measures have been discussed and provided in written form.  A prescription has been provided for levocetirizine, 5mg  daily as needed.  A prescription has been provided for Nasonex nasal spray, one spray per nostril 1-2 times daily as needed. Proper nasal spray technique has been discussed and demonstrated.  I have also recommended nasal saline spray (i.e., Simply Saline) or nasal saline lavage (i.e., NeilMed) as needed and prior to medicated nasal sprays.  If allergen avoidance measures and medications fail to adequately relieve symptoms, aeroallergen immunotherapy will be considered.

## 2016-11-29 NOTE — Assessment & Plan Note (Signed)
   Treatment plan as outlined above for allergic rhinitis.  A prescription has been provided for Pazeo, one drop per eye daily as needed.  I have also recommended eye lubricant drops (i.e., Natural Tears) as needed. 

## 2016-11-29 NOTE — Assessment & Plan Note (Signed)
The patient's history suggests food allergy to peppers.  We do not have a commercial food allergen for peppers, therefore we will send for lab work.  A laboratory order form has been provided for serum specific IgE against peppers (jalapeno, Bell, red).  Until this allergy has been ruled out, she is to carefully avoid these foods.

## 2016-11-29 NOTE — Progress Notes (Signed)
New Patient Note  RE: TIFANY Black MRN: [id number] DOB: 1980-08-24 Date of Office Visit: 11/29/2016  Referring provider: Angelica Chessman, MD Primary care provider: Angelica Chessman, MD  Chief Complaint: Allergic Rhinitis ; Nasal Congestion; and Conjunctivitis   History of present illness: Christina Black is a 36 y.o. female seen today in consultation requested by Genia Del, MD. She reports that since April or May she has been experiencing ocular pruritus, nasal congestion, sneezing, nasal pruritus, postnasal drainage, sinus pressure, and coughing.  The coughing is nonproductive and feels like it originates from the base of her throat.  She was given an unspecified medicated drop from her ophthalmologist, however her ocular symptoms have persisted despite compliance with his medication.   She reports that she used to experience dyspnea, coughing, and wheezing and was prescribed albuterol via nebulizer with benefit.  However, she has not experienced significant lower rest for symptoms or required albuterol over the past several years since moving from [place] to the [place]. Corisa reports that approximately 5 years ago she began to notice that when she consumed peppers, i.e. jalapeno peppers or Bell peppers, that she experienced pharyngeal pruritus, dyspnea, and coughing.  She currently avoids peppers.   Assessment and plan: Seasonal allergic rhinitis  Aeroallergen avoidance measures have been discussed and provided in written form.  A prescription has been provided for levocetirizine, 5mg  daily as needed.  A prescription has been provided for Nasonex nasal spray, one spray per nostril 1-2 times daily as needed. Proper nasal spray technique has been discussed and demonstrated.  I have also recommended nasal saline spray (i.e., Simply Saline) or nasal saline lavage (i.e., NeilMed) as needed and prior to medicated nasal sprays.  If allergen avoidance measures  and medications fail to adequately relieve symptoms, aeroallergen immunotherapy will be considered.  Allergic conjunctivitis  Treatment plan as outlined above for allergic rhinitis.  A prescription has been provided for Pazeo, one drop per eye daily as needed.  I have also recommended eye lubricant drops (i.e., Natural Tears) as needed.  History of food allergy The patient's history suggests food allergy to peppers.  We do not have a commercial food allergen for peppers, therefore we will send for lab work.  A laboratory order form has been provided for serum specific IgE against peppers (jalapeno, Bell, red).  Until this allergy has been ruled out, she is to carefully avoid these foods.  History of wheezing Quiescent.  Unless lower respiratory symptoms arise, we will not treat or evaluate further.   Meds ordered this encounter  Medications  . mometasone (NASONEX) 50 MCG/ACT nasal spray    Sig: Place 2 sprays into the nose daily. Two sprays each in each nostril    Dispense:  17 g    Refill:  5  . Olopatadine HCl (PAZEO) 0.7 % SOLN    Sig: Place 1 drop into both eyes 1 day or 1 dose.    Dispense:  1 Bottle    Refill:  5  . levocetirizine (XYZAL) 5 MG tablet    Sig: Take 1 tablet (5 mg total) by mouth every evening.    Dispense:  30 tablet    Refill:  5    Diagnostics: Spirometry:  Normal with an FEV1 of 84% predicted.  Please see scanned spirometry results for details. Epicutaneous testing: Positive to grass pollen, weed pollen, and tree pollen. Intradermal testing: Positive to ragweed pollen.   Physical examination: Blood pressure 98/62, pulse 79, temperature 98.2 F (  36.8 C), temperature source Oral, resp. rate 16, height 5' 5.5" (1.664 m), weight 153 lb (69.4 kg), SpO2 96 %.  General: Alert, interactive, in no acute distress. HEENT: TMs pearly gray, turbinates moderately edematous without discharge, post-pharynx mildly erythematous. Neck: Supple without  lymphadenopathy. Lungs: Clear to auscultation without wheezing, rhonchi or rales. CV: Normal S1, S2 without murmurs. Abdomen: Nondistended, nontender. Skin: Warm and dry, without lesions or rashes. Extremities:  No clubbing, cyanosis or edema. Neuro:   Grossly intact.  Review of systems:  Review of systems negative except as noted in HPI / PMHx or noted below: Review of Systems  Constitutional: Negative.   HENT: Negative.   Eyes: Negative.   Respiratory: Negative.   Cardiovascular: Negative.   Gastrointestinal: Negative.   Genitourinary: Negative.   Musculoskeletal: Negative.   Skin: Negative.   Neurological: Negative.   Endo/Heme/Allergies: Negative.   Psychiatric/Behavioral: Negative.     Past medical history:  Past Medical History:  Diagnosis Date  . Elevated hemoglobin A1c 2017   Level - 5.7  . Gestational diabetes   . Gestational diabetes   . Medical history non-contributory   . Pregnancy induced hypertension   . Status post vacuum-assisted vaginal delivery (1/15) 05/08/2014    Past surgical history:  Past Surgical History:  Procedure Laterality Date  . AUGMENTATION MAMMAPLASTY  2012   [place]  . BREAST ENHANCEMENT SURGERY    . CESAREAN SECTION  01-29-09   in [place]  . COSMETIC SURGERY  2012   liposuction--[place]    Family history: Family History  Problem Relation Age of Onset  . Cancer Father [id number]       Dec--metastatic CA lung  . Hypertension Father   . Hypertension Mother   . Allergic rhinitis Mother   . Migraines Mother   . Cancer Maternal Grandfather        Dec--pancreatic CA  . Asthma Neg Hx   . Eczema Neg Hx   . Urticaria Neg Hx   . Immunodeficiency Neg Hx   . Angioedema Neg Hx     Social history: Social History   Social History  . Marital status: Married    Spouse name: N/A  . Number of children: N/A  . Years of education: N/A   Occupational History  . Not on file.   Social History Main Topics  . Smoking status: Never  Smoker  . Smokeless tobacco: Never Used  . Alcohol use No  . Drug use: No  . Sexual activity: Yes    Partners: Male    Birth control/ protection: IUD     Comment: Mirena inserted 06/2014   Other Topics Concern  . Not on file   Social History Narrative  . No narrative on file   Environmental History: The patient lives in a house with carpeting in the bedroom and central air/heat.  There is a dog in house which does not have access to her bedroom.  She is a nonsmoker.  There is no known mold/water damage in the home.  Allergies as of 11/29/2016      Reactions   Cefazolin Anaphylaxis, Hives      Medication List       Accurate as of 11/29/16  3:09 PM. Always use your most recent med list.          diclofenac sodium 1 % Gel Commonly known as:  VOLTAREN Apply topically.   levocetirizine 5 MG tablet Commonly known as:  XYZAL Take 1 tablet (5 mg total) by  mouth every evening.   levonorgestrel 20 MCG/24HR IUD Commonly known as:  MIRENA by Intrauterine route.   mometasone 50 MCG/ACT nasal spray Commonly known as:  NASONEX Place 2 sprays into the nose daily. Two sprays each in each nostril   Olopatadine HCl 0.7 % Soln Commonly known as:  PAZEO Place 1 drop into both eyes 1 day or 1 dose.   Vitamin D 2000 units Caps Take by mouth.       Known medication allergies: Allergies  Allergen Reactions  . Cefazolin Anaphylaxis and Hives    I appreciate the opportunity to take part in Graceann's care. Please do not hesitate to contact me with questions.  Sincerely,   R. Jorene Guest, MD

## 2016-11-29 NOTE — Assessment & Plan Note (Signed)
Quiescent.  Unless lower respiratory symptoms arise, we will not treat or evaluate further. 

## 2016-12-02 LAB — ALLERGEN, BLACK PEPPER,F280: Allergen Black Pepper IgE: <0.1 kU/L

## 2016-12-02 LAB — F263-IGE GREEN BELL PEPPER: Allergen Green Bell Pepper IgE: <0.1 kU/L

## 2016-12-02 LAB — ALLERGEN,CHILI PEPPER,RF279: F279-IgE Chili Pepper: <0.1 kU/L

## 2017-07-30 ENCOUNTER — Ambulatory Visit: Payer: BLUE CROSS/BLUE SHIELD | Admitting: Obstetrics and Gynecology

## 2017-07-30 ENCOUNTER — Telehealth: Payer: Self-pay

## 2017-07-30 ENCOUNTER — Encounter: Payer: Self-pay | Admitting: Obstetrics and Gynecology

## 2017-07-30 ENCOUNTER — Other Ambulatory Visit: Payer: Self-pay

## 2017-07-30 ENCOUNTER — Other Ambulatory Visit (HOSPITAL_COMMUNITY)
Admission: RE | Admit: 2017-07-30 | Discharge: 2017-07-30 | Disposition: A | Discharge status: Home / Self Care | Payer: BLUE CROSS/BLUE SHIELD | Source: Ambulatory Visit | Attending: Obstetrics and Gynecology | Admitting: Obstetrics and Gynecology

## 2017-07-30 VITALS — BP 102/60 | HR 68 | Resp 16 | Ht 64.5 in | Wt 155.0 lb

## 2017-07-30 DIAGNOSIS — Z01419 Encounter for gynecological examination (general) (routine) without abnormal findings: Secondary | ICD-10-CM

## 2017-07-30 DIAGNOSIS — Z01411 Encounter for gynecological examination (general) (routine) with abnormal findings: Secondary | ICD-10-CM | POA: Insufficient documentation

## 2017-07-30 DIAGNOSIS — Z7189 Other specified counseling: Secondary | ICD-10-CM

## 2017-07-30 DIAGNOSIS — R7989 Other specified abnormal findings of blood chemistry: Secondary | ICD-10-CM | POA: Diagnosis not present

## 2017-07-30 DIAGNOSIS — R232 Flushing: Secondary | ICD-10-CM | POA: Diagnosis not present

## 2017-07-30 DIAGNOSIS — N760 Acute vaginitis: Secondary | ICD-10-CM | POA: Diagnosis not present

## 2017-07-30 DIAGNOSIS — N941 Unspecified dyspareunia: Secondary | ICD-10-CM | POA: Diagnosis not present

## 2017-07-30 DIAGNOSIS — Z9882 Breast implant status: Secondary | ICD-10-CM | POA: Insufficient documentation

## 2017-07-30 DIAGNOSIS — Z1151 Encounter for screening for human papillomavirus (HPV): Secondary | ICD-10-CM | POA: Diagnosis not present

## 2017-07-30 DIAGNOSIS — Z7185 Encounter for immunization safety counseling: Secondary | ICD-10-CM

## 2017-07-30 DIAGNOSIS — Z975 Presence of (intrauterine) contraceptive device: Secondary | ICD-10-CM | POA: Insufficient documentation

## 2017-07-30 NOTE — Telephone Encounter (Signed)
Patient left before receiving 1st Gardasil injection. Call to patient to see if she is able to come back to office. Patient unable to return to office today (07/30/17). Scheduled patient as a nurse visit 08/07/17. Patient is currently on Mirena IUD for contraception.

## 2017-07-30 NOTE — Progress Notes (Signed)
37 y.o. G33P2012 Married [place] female here for annual exam.    Having headache almost every day.  Feeling cold during the day and heat at night.  Loosing hair.  Having acne.   No menstrual bleeding at all.   Tenderness with intercourse. Some discharge at times.  PCP:  Dr. Zandra Abts   No LMP recorded (lmp unknown). (Menstrual status: IUD).           Sexually active: Yes.    The current method of family planning is Mirena inserted 06/2014.    Exercising: Yes.    walking Smoker:  no  Health Maintenance: Pap:  2016 normal per patient History of abnormal Pap:  no MMG:  07/29/15 Diagnostic Bilateral MMG/Right Breast US BIRADS 2 benign/density c TDaP:  2016 Gardasil:   unsure HIV: negative in pregnancy Hep C: never Screening Labs:  PCP   reports that she has never smoked. She has never used smokeless tobacco. She reports that she does not drink alcohol or use drugs.  Past Medical History:  Diagnosis Date  . Elevated hemoglobin A1c 2017   Level - 5.7  . Gestational diabetes   . Gestational diabetes   . Medical history non-contributory   . Pregnancy induced hypertension   . Status post vacuum-assisted vaginal delivery (1/15) 05/08/2014    Past Surgical History:  Procedure Laterality Date  . AUGMENTATION MAMMAPLASTY  2012   Silicone in [place]  . BREAST ENHANCEMENT SURGERY    . CESAREAN SECTION  01-29-09   in [place]  . COSMETIC SURGERY  2012   liposuction--[place]    Current Outpatient Medications  Medication Sig Dispense Refill  . Cholecalciferol (VITAMIN D) 2000 units CAPS Take by mouth.    . diclofenac sodium (VOLTAREN) 1 % GEL Apply topically.    Marland Kitchen levocetirizine (XYZAL) 5 MG tablet Take 1 tablet (5 mg total) by mouth every evening. 30 tablet 5  . levonorgestrel (MIRENA) 20 MCG/24HR IUD by Intrauterine route.    . mometasone (NASONEX) 50 MCG/ACT nasal spray Place 2 sprays into the nose daily. Two sprays each in each nostril 17 g 5   No current  facility-administered medications for this visit.     Family History  Problem Relation Age of Onset  . Cancer Father 16       Dec--metastatic CA lung  . Hypertension Father   . Hypertension Mother   . Allergic rhinitis Mother   . Migraines Mother   . Cancer Maternal Grandfather        Dec--pancreatic CA  . Asthma Neg Hx   . Eczema Neg Hx   . Urticaria Neg Hx   . Immunodeficiency Neg Hx   . Angioedema Neg Hx     ROS:  Pertinent items are noted in HPI.  Otherwise, a comprehensive ROS was negative.  Exam:   BP 102/60 (BP Location: Right Arm, Patient Position: Sitting, Cuff Size: Normal)   Pulse 68   Resp 16   Ht 5' 4.5" (1.638 m)   Wt 155 lb (70.3 kg)   LMP  (LMP Unknown)   BMI 26.19 kg/m     General appearance: alert, cooperative and appears stated age Head: Normocephalic, without obvious abnormality, atraumatic Neck: no adenopathy, supple, symmetrical, trachea midline and thyroid normal to inspection and palpation Lungs: clear to auscultation bilaterally Breasts:  Bilateral implants, no masses or tenderness, No nipple retraction or dimpling, No nipple discharge or bleeding, No axillary or supraclavicular adenopathy Heart: regular rate and rhythm Abdomen: soft, non-tender; no masses,  no organomegaly Extremities: extremities normal, atraumatic, no cyanosis or edema Skin: Skin color, texture, turgor normal. No rashes or lesions Lymph nodes: Cervical, supraclavicular, and axillary nodes normal. No abnormal inguinal nodes palpated Neurologic: Grossly normal  Pelvic: External genitalia:  no lesions              Urethra:  normal appearing urethra with no masses, tenderness or lesions              Bartholins and Skenes: normal                 Vagina: normal appearing vagina with normal color and discharge, no lesions              Cervix: no lesions.  IUD strings noted.              Pap taken: Yes.   Bimanual Exam:  Uterus:  normal size, contour, position, consistency,  mobility, non-tender              Adnexa: no mass, fullness, tenderness              Rectal exam: No..  Confirms.              Anus:  normal sphincter tone, no lesions  Chaperone was present for exam.  Assessment:   Well woman visit with normal exam. Mirena IUD.  Hx breast augmentation.  Vaginitis.  Dyspareunia.  Hot flashes  Plan: Mammogram screening discussed. Recommended self breast awareness. Pap and HR HPV as above. Vaginitis testing from pap.  Discussed lubricants.  Guidelines for Calcium, Vitamin D, regular exercise program including cardiovascular and weight bearing exercise. Discussed Gardasil vaccine.  Will start at future date. Routine labs, FSH, E2.  If HA persistent, to PCP. Follow up annually and prn.      After visit summary provided.

## 2017-07-30 NOTE — Patient Instructions (Signed)

## 2017-07-31 LAB — LIPID PANEL
CHOL/HDL RATIO: 3.8 ratio (ref 0.0–4.4)
Cholesterol, Total: 208 mg/dL — ABNORMAL HIGH (ref 100–199)
HDL: 55 mg/dL (ref >39)
LDL Calculated: 129 mg/dL — ABNORMAL HIGH (ref 0–99)
TRIGLYCERIDES: 121 mg/dL (ref 0–149)
VLDL CHOLESTEROL CAL: 24 mg/dL (ref 5–40)

## 2017-07-31 LAB — COMPREHENSIVE METABOLIC PANEL
A/G RATIO: 2 (ref 1.2–2.2)
ALT: 14 IU/L (ref 0–32)
AST: 18 IU/L (ref 0–40)
Albumin: 4.6 g/dL (ref 3.5–5.5)
Alkaline Phosphatase: 64 IU/L (ref 39–117)
BILIRUBIN TOTAL: 0.3 mg/dL (ref 0.0–1.2)
BUN/Creatinine Ratio: 14 (ref 9–23)
BUN: 11 mg/dL (ref 6–20)
CALCIUM: 9.3 mg/dL (ref 8.7–10.2)
CHLORIDE: 102 mmol/L (ref 96–106)
CO2: 28 mmol/L (ref 20–29)
Creatinine, Ser: 0.8 mg/dL (ref 0.57–1.00)
GFR, EST AFRICAN AMERICAN: 110 mL/min/{1.73_m2} (ref >59)
GFR, EST NON AFRICAN AMERICAN: 95 mL/min/{1.73_m2} (ref >59)
GLOBULIN, TOTAL: 2.3 g/dL (ref 1.5–4.5)
Glucose: 88 mg/dL (ref 65–99)
POTASSIUM: 4.1 mmol/L (ref 3.5–5.2)
SODIUM: 142 mmol/L (ref 134–144)
TOTAL PROTEIN: 6.9 g/dL (ref 6.0–8.5)

## 2017-07-31 LAB — CBC
HEMATOCRIT: 38.6 % (ref 34.0–46.6)
Hemoglobin: 13.3 g/dL (ref 11.1–15.9)
MCH: 30.4 pg (ref 26.6–33.0)
MCHC: 34.5 g/dL (ref 31.5–35.7)
MCV: 88 fL (ref 79–97)
PLATELETS: 275 10*3/uL (ref 150–379)
RBC: 4.38 x10E6/uL (ref 3.77–5.28)
RDW: 13.6 % (ref 12.3–15.4)
WBC: 9.8 10*3/uL (ref 3.4–10.8)

## 2017-07-31 LAB — TSH: TSH: 1.23 u[IU]/mL (ref 0.450–4.500)

## 2017-07-31 LAB — ESTRADIOL: Estradiol: 37.3 pg/mL

## 2017-07-31 LAB — HEMOGLOBIN A1C
ESTIMATED AVERAGE GLUCOSE: 111 mg/dL
Hgb A1c MFr Bld: 5.5 % (ref 4.8–5.6)

## 2017-07-31 LAB — FOLLICLE STIMULATING HORMONE: FSH: 6 m[IU]/mL

## 2017-07-31 LAB — VITAMIN D 25 HYDROXY (VIT D DEFICIENCY, FRACTURES): Vit D, 25-Hydroxy: 23.9 ng/mL — ABNORMAL LOW (ref 30.0–100.0)

## 2017-07-31 NOTE — Addendum Note (Signed)
Addended by: Ardell IsaacsAMUNDSON C SILVA, Debbe BalesBROOK E on: 07/31/2017 05:23 PM   Modules accepted: Orders

## 2017-08-01 LAB — CYTOLOGY - PAP
Bacterial vaginitis: POSITIVE — AB
CANDIDA VAGINITIS: POSITIVE — AB
Diagnosis: NEGATIVE
HPV (WINDOPATH): NOT DETECTED
TRICH (WINDOWPATH): NEGATIVE

## 2017-08-02 ENCOUNTER — Other Ambulatory Visit: Payer: Self-pay | Admitting: *Deleted

## 2017-08-02 MED ORDER — FLUCONAZOLE 150 MG PO TABS: ORAL_TABLET | ORAL | 0 refills | Status: DC

## 2017-08-02 MED ORDER — METRONIDAZOLE 500 MG PO TABS: 500.0000 mg | ORAL_TABLET | Freq: Two times a day (BID) | ORAL | 0 refills | Status: DC

## 2017-08-07 ENCOUNTER — Ambulatory Visit (INDEPENDENT_AMBULATORY_CARE_PROVIDER_SITE_OTHER): Payer: BLUE CROSS/BLUE SHIELD

## 2017-08-07 VITALS — BP 100/60 | HR 70 | Ht 64.5 in | Wt 155.0 lb

## 2017-08-07 DIAGNOSIS — Z23 Encounter for immunization: Secondary | ICD-10-CM

## 2017-08-07 NOTE — Progress Notes (Signed)
Patient here today for 1st Gardasil vaccine. LMP none with Mirena IUD--inserted 06/2014.  Gardasil vaccine given Left deltoid and patient tolerated injection well.  Next injection due 10-07-17.

## 2017-08-20 ENCOUNTER — Ambulatory Visit: Payer: BLUE CROSS/BLUE SHIELD | Admitting: Obstetrics and Gynecology

## 2017-10-07 ENCOUNTER — Ambulatory Visit: Payer: BLUE CROSS/BLUE SHIELD

## 2017-10-07 ENCOUNTER — Ambulatory Visit (INDEPENDENT_AMBULATORY_CARE_PROVIDER_SITE_OTHER): Payer: BLUE CROSS/BLUE SHIELD

## 2017-10-07 VITALS — BP 100/62 | HR 60 | Ht 64.5 in | Wt 156.6 lb

## 2017-10-07 DIAGNOSIS — Z23 Encounter for immunization: Secondary | ICD-10-CM

## 2017-10-07 NOTE — Progress Notes (Signed)
Patient in today for 2nd Gardasil injection.   Contraception: Mirena IUD LMP: IUD Last AEX: 07-30-17 with Dr.Silva  Injection given in Rt.deltoid. Patient tolerated shot well.   Patient informed next injection due in about 4 months.  Advised patient, if not on birth control, to return for next injection with cycle.   Routed to provider for final review.  Encounter closed.

## 2017-10-30 ENCOUNTER — Other Ambulatory Visit (INDEPENDENT_AMBULATORY_CARE_PROVIDER_SITE_OTHER): Payer: BLUE CROSS/BLUE SHIELD

## 2017-10-30 DIAGNOSIS — R7989 Other specified abnormal findings of blood chemistry: Secondary | ICD-10-CM

## 2017-10-31 LAB — VITAMIN D 25 HYDROXY (VIT D DEFICIENCY, FRACTURES): Vit D, 25-Hydroxy: 22.5 ng/mL — ABNORMAL LOW (ref 30.0–100.0)

## 2017-11-01 ENCOUNTER — Other Ambulatory Visit: Payer: Self-pay | Admitting: *Deleted

## 2017-11-01 MED ORDER — VITAMIN D (ERGOCALCIFEROL) 1.25 MG (50000 UNIT) PO CAPS: 50000.0000 [IU] | ORAL_CAPSULE | ORAL | 0 refills | Status: DC

## 2018-02-04 NOTE — Progress Notes (Signed)
Patient in today for 3rd Gardasil injection.   Contraception: IUD LMP: IUD Last AEX: 07/30/17  with BS  Injection given in Left Deltoid. Patient tolerated shot well.    Routed to provider for final review.  Encounter closed.

## 2018-02-07 ENCOUNTER — Ambulatory Visit (INDEPENDENT_AMBULATORY_CARE_PROVIDER_SITE_OTHER): Payer: BLUE CROSS/BLUE SHIELD

## 2018-02-07 VITALS — BP 104/78 | HR 60 | Wt 157.0 lb

## 2018-02-07 DIAGNOSIS — Z23 Encounter for immunization: Secondary | ICD-10-CM | POA: Diagnosis not present

## 2018-02-07 DIAGNOSIS — R7989 Other specified abnormal findings of blood chemistry: Secondary | ICD-10-CM | POA: Diagnosis not present

## 2018-02-08 LAB — VITAMIN D 25 HYDROXY (VIT D DEFICIENCY, FRACTURES): Vit D, 25-Hydroxy: 26.7 ng/mL — ABNORMAL LOW (ref 30.0–100.0)

## 2018-02-10 NOTE — Addendum Note (Signed)
Addended by: Ardell Isaacs, BROOK E on: 02/10/2018 10:31 AM   Modules accepted: Orders

## 2018-02-11 ENCOUNTER — Other Ambulatory Visit: Payer: Self-pay | Admitting: *Deleted

## 2018-02-11 MED ORDER — VITAMIN D (ERGOCALCIFEROL) 1.25 MG (50000 UNIT) PO CAPS: 50000.0000 [IU] | ORAL_CAPSULE | ORAL | 0 refills | Status: DC

## 2018-02-13 ENCOUNTER — Other Ambulatory Visit: Payer: Self-pay | Admitting: Obstetrics and Gynecology

## 2018-02-14 ENCOUNTER — Encounter: Payer: Self-pay | Admitting: Obstetrics and Gynecology

## 2018-02-14 ENCOUNTER — Telehealth: Payer: Self-pay | Admitting: Obstetrics and Gynecology

## 2018-02-14 MED ORDER — VITAMIN D (ERGOCALCIFEROL) 1.25 MG (50000 UNIT) PO CAPS: 50000.0000 [IU] | ORAL_CAPSULE | ORAL | 0 refills | Status: DC

## 2018-02-14 NOTE — Telephone Encounter (Signed)
Spoke with Rodney Booze at [facility]. Rx for Vitamin D 50,000 IU sent today for #4/0RF caps and #2/0RF cancelled. Confirmed patient has RX on file for Vitamin D #50,000IU sent on 02/11/18, has picked up 2 caps, 1 month supply;  4 tabs remaining.   Call returned to patient to notify. Patient verbalizes understanding.  Routing to provider for final review. Patient is agreeable to disposition. Will close encounter.

## 2018-02-14 NOTE — Telephone Encounter (Signed)
Patient sent the following correspondence through MyChart. Routing to triage to assist patient with request.  I got only 2 capsules of vitamin D, i just eould like to confirm how many Am I supposed to take or if i have to request refill every month.  Thanks

## 2018-02-14 NOTE — Telephone Encounter (Signed)
I sent in the prescription for 4 capsules earlier today as I thought she had already picked up the prescription for 2 capsules.  Sorry for any confusion.

## 2018-02-14 NOTE — Telephone Encounter (Signed)
Spoke with patient. Patient received 1 month supply of Vitamin D (2 capsules), Rx was sent for 4 capsules, should have been #6 for 3 months supply.   Additional Rx for Vitamin D 50,000 IU #2/0Rf sent to verified pharmacy. Apologies to patient for any inconvenience. Patient thankful for return call.   Routing to provider for final review. Patient is agreeable to disposition. Will close encounter.

## 2018-02-14 NOTE — Addendum Note (Signed)
Addended by: Leda Min on: 02/14/2018 10:24 AM   Modules accepted: Orders

## 2018-05-23 ENCOUNTER — Other Ambulatory Visit: Payer: Self-pay

## 2018-05-30 ENCOUNTER — Other Ambulatory Visit (INDEPENDENT_AMBULATORY_CARE_PROVIDER_SITE_OTHER): Payer: BLUE CROSS/BLUE SHIELD

## 2018-05-30 DIAGNOSIS — R7989 Other specified abnormal findings of blood chemistry: Secondary | ICD-10-CM

## 2018-05-31 LAB — VITAMIN D 25 HYDROXY (VIT D DEFICIENCY, FRACTURES): VIT D 25 HYDROXY: 21.5 ng/mL — AB (ref 30.0–100.0)

## 2018-06-01 MED ORDER — VITAMIN D (ERGOCALCIFEROL) 1.25 MG (50000 UNIT) PO CAPS: 50000.0000 [IU] | ORAL_CAPSULE | ORAL | 0 refills | Status: DC

## 2018-08-25 ENCOUNTER — Ambulatory Visit: Payer: BLUE CROSS/BLUE SHIELD | Admitting: Obstetrics and Gynecology

## 2018-10-10 ENCOUNTER — Other Ambulatory Visit: Payer: Self-pay

## 2018-10-14 ENCOUNTER — Other Ambulatory Visit (HOSPITAL_COMMUNITY)
Admission: RE | Admit: 2018-10-14 | Discharge: 2018-10-14 | Disposition: A | Discharge status: Home / Self Care | Payer: BC Managed Care – PPO | Source: Ambulatory Visit | Attending: Obstetrics and Gynecology | Admitting: Obstetrics and Gynecology

## 2018-10-14 ENCOUNTER — Encounter: Payer: Self-pay | Admitting: Obstetrics and Gynecology

## 2018-10-14 ENCOUNTER — Ambulatory Visit (INDEPENDENT_AMBULATORY_CARE_PROVIDER_SITE_OTHER): Payer: BC Managed Care – PPO | Admitting: Obstetrics and Gynecology

## 2018-10-14 ENCOUNTER — Other Ambulatory Visit: Payer: Self-pay

## 2018-10-14 VITALS — BP 102/60 | HR 68 | Temp 97.7°F | Resp 14 | Ht 65.0 in | Wt 155.4 lb

## 2018-10-14 DIAGNOSIS — R102 Pelvic and perineal pain: Secondary | ICD-10-CM | POA: Diagnosis not present

## 2018-10-14 DIAGNOSIS — R32 Unspecified urinary incontinence: Secondary | ICD-10-CM | POA: Diagnosis not present

## 2018-10-14 DIAGNOSIS — Z01419 Encounter for gynecological examination (general) (routine) without abnormal findings: Secondary | ICD-10-CM | POA: Insufficient documentation

## 2018-10-14 DIAGNOSIS — N930 Postcoital and contact bleeding: Secondary | ICD-10-CM

## 2018-10-14 DIAGNOSIS — N762 Acute vulvitis: Secondary | ICD-10-CM

## 2018-10-14 LAB — RESULTS CONSOLE HPV: CHL HPV: NEGATIVE

## 2018-10-14 LAB — HM PAP SMEAR: HM Pap smear: NORMAL

## 2018-10-14 MED ORDER — FLUCONAZOLE 150 MG PO TABS: 150.0000 mg | ORAL_TABLET | Freq: Every day | ORAL | 0 refills | Status: DC

## 2018-10-14 MED ORDER — NYSTATIN-TRIAMCINOLONE 100000-0.1 UNIT/GM-% EX CREA: 1.0000 "application " | TOPICAL_CREAM | Freq: Two times a day (BID) | CUTANEOUS | 0 refills | Status: DC

## 2018-10-14 NOTE — Patient Instructions (Signed)

## 2018-10-14 NOTE — Progress Notes (Signed)
38 y.o. G2P2012 Married [place] female here for annual exam.    Still with bladder problems.  Using pads daily.  Wakes up with wet underwear.  Leaking with exercise and coughing.  Some urgency and difficulty to control urine and then leaks. Underwear wet when she wakes up.  She did physical therapy, and she had some resolution of her problem.  She is interested in potential surgery.  She has regular BMS.  Some constipation.  No splinting.  No fecal incontinence.   Has lower pelvic pain every day.   She does not want to continue with Mirena IUD.  She has a lot of headaches.  She has postcoital bleeding.  Declines future childbearing.   Also having lots of itching on the vulva essentially when she takes a bath. No vaginal itching.  Using Vagisil. Using Vaseline also.  PCP: Rolan Lipa, MD   No LMP recorded (lmp unknown). (Menstrual status: IUD).           Sexually active: Yes.    The current method of family planning is IUD.   Mirena IUD inserted 06/2014. Exercising: Yes.    walking Smoker:  no  Health Maintenance: Pap:  07/30/17 Neg:Neg HR HPV History of abnormal Pap:  no MMG:  07/29/15 Diagnostic Bilateral MMG/Right Breast US BIRADS 2 benign/density c TDaP:  2016 Gardasil:   yes HIV: negative in pregnancy Hep C: never Screening Labs:  Discuss today   reports that she has never smoked. She has never used smokeless tobacco. She reports that she does not drink alcohol or use drugs.  Past Medical History:  Diagnosis Date  . Elevated hemoglobin A1c 2017   Level - 5.7  . Gestational diabetes   . Gestational diabetes   . Low vitamin D level   . Medical history non-contributory   . Pregnancy induced hypertension   . Status post vacuum-assisted vaginal delivery (1/15) 05/08/2014    Past Surgical History:  Procedure Laterality Date  . AUGMENTATION MAMMAPLASTY  1610   Silicone in [place]  . BREAST ENHANCEMENT SURGERY    . CESAREAN SECTION  01-29-09   in [place]   . COSMETIC SURGERY  2012   liposuction--[place]    Current Outpatient Medications  Medication Sig Dispense Refill  . Cholecalciferol (VITAMIN D) 2000 units CAPS Take by mouth.    . levocetirizine (XYZAL) 5 MG tablet Take 1 tablet (5 mg total) by mouth every evening. 30 tablet 5  . levonorgestrel (MIRENA) 20 MCG/24HR IUD by Intrauterine route.    . Vitamin D, Ergocalciferol, (DRISDOL) 1.25 MG (50000 UT) CAPS capsule Take 1 capsule (50,000 Units total) by mouth every 7 (seven) days. (Patient not taking: Reported on 10/14/2018) 12 capsule 0   No current facility-administered medications for this visit.     Family History  Problem Relation Age of Onset  . Cancer Father 54       Dec--metastatic CA lung  . Hypertension Father   . Hypertension Mother   . Allergic rhinitis Mother   . Migraines Mother   . Cancer Maternal Grandfather        Dec--pancreatic CA  . Asthma Neg Hx   . Eczema Neg Hx   . Urticaria Neg Hx   . Immunodeficiency Neg Hx   . Angioedema Neg Hx     Review of Systems  Constitutional: Negative.   HENT: Negative.   Eyes: Negative.   Respiratory: Negative.   Cardiovascular: Negative.   Gastrointestinal: Negative.   Endocrine: Negative.   Genitourinary: Negative.  Musculoskeletal: Negative.   Skin: Negative.   Allergic/Immunologic: Negative.   Neurological: Negative.   Hematological: Negative.   Psychiatric/Behavioral: Negative.     Exam:   BP 102/60 (BP Location: Right Arm, Patient Position: Sitting, Cuff Size: Normal)   Pulse 68   Temp 97.7 F (36.5 C) (Temporal)   Resp 14   Ht 5\' 5"  (1.651 m)   Wt 155 lb 6.4 oz (70.5 kg)   LMP  (LMP Unknown)   BMI 25.86 kg/m     General appearance: alert, cooperative and appears stated age Head: normocephalic, without obvious abnormality, atraumatic Neck: no adenopathy, supple, symmetrical, trachea midline and thyroid normal to inspection and palpation Lungs: clear to auscultation bilaterally Breasts: normal  appearance, no masses or tenderness, No nipple retraction or dimpling, No nipple discharge or bleeding, No axillary adenopathy Heart: regular rate and rhythm Abdomen: soft, non-tender; no masses, no organomegaly Extremities: extremities normal, atraumatic, no cyanosis or edema Skin: skin color, texture, turgor normal. No rashes or lesions Lymph nodes: cervical, supraclavicular, and axillary nodes normal. Neurologic: grossly normal  Pelvic: External genitalia:  Erythema and fissures of the vulva.               No abnormal inguinal nodes palpated.              Urethra:  normal appearing urethra with no masses, tenderness or lesions              Bartholins and Skenes: normal                 Vagina: normal appearing vagina with normal color and discharge, no lesions              Cervix: no lesions.  IUD strings noted.               Pap taken: Yes.   Bimanual Exam:  Uterus:  normal size, contour, position, consistency, mobility, non-tender              Adnexa: no mass, fullness, tenderness                Chaperone was present for exam.  Assessment:   Well woman visit with normal exam. Mirena IUD.  Hx breast augmentation.  Pelvic pain.  Declines future childbearing.  Postcoital bleeding.  Urinary incontinence.  Vulvitis.  Low vit D.  Hx GDM.   Plan: Mammogram screening age 38. Self breast awareness reviewed. Pap and HR HPV as above. Guidelines for Calcium, Vitamin D, regular exercise program including cardiovascular and weight bearing exercise. Return for urodynamics.  Routine labs.  Return for pelvic US.  Mycolog II and Dilfucan.  Affirm taken.  Switch pads and use only Dove. Follow up annually and prn.   After visit summary provided.

## 2018-10-15 ENCOUNTER — Telehealth: Payer: Self-pay | Admitting: Obstetrics and Gynecology

## 2018-10-15 LAB — COMPREHENSIVE METABOLIC PANEL
ALT: 10 IU/L (ref 0–32)
AST: 14 IU/L (ref 0–40)
Albumin/Globulin Ratio: 2.2 (ref 1.2–2.2)
Albumin: 4.3 g/dL (ref 3.8–4.8)
Alkaline Phosphatase: 50 IU/L (ref 39–117)
BUN/Creatinine Ratio: 19 (ref 9–23)
BUN: 12 mg/dL (ref 6–20)
Bilirubin Total: 0.4 mg/dL (ref 0.0–1.2)
CO2: 26 mmol/L (ref 20–29)
Calcium: 9.3 mg/dL (ref 8.7–10.2)
Chloride: 102 mmol/L (ref 96–106)
Creatinine, Ser: 0.64 mg/dL (ref 0.57–1.00)
GFR calc Af Amer: 132 mL/min/{1.73_m2} (ref >59)
GFR calc non Af Amer: 114 mL/min/{1.73_m2} (ref >59)
Globulin, Total: 2 g/dL (ref 1.5–4.5)
Glucose: 91 mg/dL (ref 65–99)
Potassium: 4.2 mmol/L (ref 3.5–5.2)
Sodium: 140 mmol/L (ref 134–144)
Total Protein: 6.3 g/dL (ref 6.0–8.5)

## 2018-10-15 LAB — CBC
Hematocrit: 38.3 % (ref 34.0–46.6)
Hemoglobin: 12.9 g/dL (ref 11.1–15.9)
MCH: 31.2 pg (ref 26.6–33.0)
MCHC: 33.7 g/dL (ref 31.5–35.7)
MCV: 93 fL (ref 79–97)
Platelets: 276 10*3/uL (ref 150–450)
RBC: 4.13 x10E6/uL (ref 3.77–5.28)
RDW: 13.2 % (ref 11.7–15.4)
WBC: 6.5 10*3/uL (ref 3.4–10.8)

## 2018-10-15 LAB — VAGINITIS/VAGINOSIS, DNA PROBE
Candida Species: POSITIVE — AB
Gardnerella vaginalis: POSITIVE — AB
Trichomonas vaginosis: NEGATIVE

## 2018-10-15 LAB — LIPID PANEL
Chol/HDL Ratio: 3.9 ratio (ref 0.0–4.4)
Cholesterol, Total: 217 mg/dL — ABNORMAL HIGH (ref 100–199)
HDL: 55 mg/dL (ref >39)
LDL Calculated: 143 mg/dL — ABNORMAL HIGH (ref 0–99)
Triglycerides: 97 mg/dL (ref 0–149)
VLDL Cholesterol Cal: 19 mg/dL (ref 5–40)

## 2018-10-15 LAB — VITAMIN D 25 HYDROXY (VIT D DEFICIENCY, FRACTURES): Vit D, 25-Hydroxy: 30.4 ng/mL (ref 30.0–100.0)

## 2018-10-15 LAB — HEMOGLOBIN A1C
Est. average glucose Bld gHb Est-mCnc: 108 mg/dL
Hgb A1c MFr Bld: 5.4 % (ref 4.8–5.6)

## 2018-10-15 LAB — TSH: TSH: 1.49 u[IU]/mL (ref 0.450–4.500)

## 2018-10-15 NOTE — Telephone Encounter (Signed)
Returned call to patient. Reviewed benefit for recommended ultrasound. Patient understood information and is ready to proceed with scheduling. Patient is scheduled 10/23/2018 with Dr Quincy Simmonds. Patient is aware of the appointment date, arrival time and cancellation policy. No further questions.  Also reviewed with patient benefit for urodynamic testing. Patient understood information presented. Patient is interested, advised once dates for urodynamic testing have been established, we will contact her for scheduling. Patient is agreeable.   forwarding to Lamont Snowball, RN. Patient is agreeable to disposition. Will close encounter

## 2018-10-15 NOTE — Telephone Encounter (Signed)
Call placed to patient to review benefit and schedule recommended ultrasound. Left voicemail requesting a return call

## 2018-10-15 NOTE — Telephone Encounter (Signed)
Patient is returning a call to Suzy. °

## 2018-10-16 LAB — CYTOLOGY - PAP
Diagnosis: NEGATIVE
HPV: NOT DETECTED

## 2018-10-17 ENCOUNTER — Telehealth: Payer: Self-pay

## 2018-10-17 MED ORDER — METRONIDAZOLE 500 MG PO TABS: 500.0000 mg | ORAL_TABLET | Freq: Two times a day (BID) | ORAL | 0 refills | Status: DC

## 2018-10-17 NOTE — Telephone Encounter (Signed)
Spoke with patient. Notified of results as stated in Dr.Silva's note below. Will send Rx for Flagyl 500mg  1 po bid #14,NR to pharmacy on file. ETOH precautions given. Pap notified of normal pap and Neg HR HPV. 02 Recall--next AEX 10-28-19. Advised of elevated LDL and recommended dietary changes per Dr.Silva and following up with PCP. Patient voiced understanding.

## 2018-10-17 NOTE — Telephone Encounter (Signed)
-----   Message from Nunzio Cobbs, MD sent at 10/16/2018  5:22 PM EDT ----- Please contact patient with results.   Her vaginitis testing showed BV and yeast.  Her yeast was already treated.  She may treat the BV with Flagyl 500 mg po bid for 7 days or Metrogel pv at hs for 5 nights.  Please send Rx to pharmacy of choice. ETOH precautions.   Her pap is normal and her HR HPV is negative.  Pap recall - 02.  Her cholesterol panel is showing elevated LDL cholesterol.  Her cholesterol ratios are still ok, but the LDL is climbing over the last 3 years.  I would recommend she start making some dietary changes and focus on a diet low in cholesterol.  She may wish to follow up with her PCP regarding this.   Her blood counts, blood chemistries, hemoglobin A1C, thyroid and vitamin D are all normal.

## 2018-10-21 ENCOUNTER — Other Ambulatory Visit: Payer: Self-pay

## 2018-10-23 ENCOUNTER — Other Ambulatory Visit: Payer: Self-pay

## 2018-10-23 ENCOUNTER — Encounter: Payer: Self-pay | Admitting: Obstetrics and Gynecology

## 2018-10-23 ENCOUNTER — Ambulatory Visit: Payer: BC Managed Care – PPO | Admitting: Obstetrics and Gynecology

## 2018-10-23 ENCOUNTER — Other Ambulatory Visit: Payer: BC Managed Care – PPO

## 2018-10-23 ENCOUNTER — Ambulatory Visit (INDEPENDENT_AMBULATORY_CARE_PROVIDER_SITE_OTHER): Payer: BC Managed Care – PPO

## 2018-10-23 VITALS — BP 100/68 | HR 80 | Temp 97.7°F | Resp 12 | Ht 65.0 in | Wt 153.0 lb

## 2018-10-23 DIAGNOSIS — R102 Pelvic and perineal pain: Secondary | ICD-10-CM | POA: Diagnosis not present

## 2018-10-23 DIAGNOSIS — R82998 Other abnormal findings in urine: Secondary | ICD-10-CM

## 2018-10-23 DIAGNOSIS — Z30431 Encounter for routine checking of intrauterine contraceptive device: Secondary | ICD-10-CM | POA: Diagnosis not present

## 2018-10-23 DIAGNOSIS — G43829 Menstrual migraine, not intractable, without status migrainosus: Secondary | ICD-10-CM | POA: Diagnosis not present

## 2018-10-23 MED ORDER — ESTRADIOL 0.05 MG/24HR TD PTTW: 1.0000 | MEDICATED_PATCH | TRANSDERMAL | 3 refills | Status: DC

## 2018-10-23 NOTE — Progress Notes (Signed)
GYNECOLOGY  VISIT   HPI: 38 y.o.   Married  [place]  female   [id number] with No LMP recorded (lmp unknown). (Menstrual status: IUD).   here for ultrasound for postcoital bleeding and daily pelvic pain.   Her pain is better since taking the Flagyl for BV.  Her discharge has improved.  Her urine is much darker.  Denies pain.   She desires removal of her Mirena IUD eventually.  She has headache and swelling during her menses. 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 Had migraines during adolescence.   Menses every 40 days more or less.   Her husband is looking into vasectomy.   GYNECOLOGIC HISTORY: No LMP recorded (lmp unknown). (Menstrual status: IUD). Contraception:  Mirena IUD inserted 06/2014 Menopausal hormone therapy:  n/a Last mammogram:  07/29/15 Diagnostic Bilateral MMG/Right Breast US BIRADS 2 benign/density c Last pap smear:    07/30/17 Neg:Neg HR HPV        OB History    Gravida  3   Para  2   Term  2   Preterm  0   AB  1   Living  2     SAB  1   TAB  0   Ectopic  0   Multiple  0   Live Births  2              Patient Active Problem List   Diagnosis Date Noted  . Seasonal allergic rhinitis 11/29/2016  . Allergic conjunctivitis 11/29/2016  . History of food allergy 11/29/2016  . History of wheezing 11/29/2016  . Seasonal allergies 11/13/2016  . Tenosynovitis, de Quervain 11/13/2016  . Stress incontinence 10/22/2014  . VBAC, delivered, current hospitalization 05/10/2014  . IDA (iron deficiency anemia) 05/10/2014  . Dependent edema 05/10/2014  . Maternal care for scar from previous cesarean delivery 05/10/2014  . Postpartum state 05/08/2014  . Status post vacuum-assisted vaginal delivery (1/15) 05/08/2014  . Gestational hypertension without significant proteinuria in third trimester 05/07/2014    Past Medical History:  Diagnosis Date  . Elevated hemoglobin A1c 2017   Level - 5.7  . Gestational diabetes   . Gestational diabetes   .  Low vitamin D level   . Medical history non-contributory   . Pregnancy induced hypertension   . Status post vacuum-assisted vaginal delivery (1/15) 05/08/2014    Past Surgical History:  Procedure Laterality Date  . AUGMENTATION MAMMAPLASTY  2012   [place] in [place]  . BREAST ENHANCEMENT SURGERY    . CESAREAN SECTION  01-29-09   [place]  . COSMETIC SURGERY  2012   liposuction--[place]    Current Outpatient Medications  Medication Sig Dispense Refill  . Cholecalciferol (VITAMIN D) 2000 units CAPS Take by mouth.    . levocetirizine (XYZAL) 5 MG tablet Take 1 tablet (5 mg total) by mouth every evening. 30 tablet 5  . levonorgestrel (MIRENA) 20 MCG/24HR IUD by Intrauterine route.    . metroNIDAZOLE (FLAGYL) 500 MG tablet Take 1 tablet (500 mg total) by mouth 2 (two) times daily. 14 tablet 0  . Vitamin D, Ergocalciferol, (DRISDOL) 1.25 MG (50000 UT) CAPS capsule Take 1 capsule (50,000 Units total) by mouth every 7 (seven) days. 12 capsule 0   No current facility-administered medications for this visit.      ALLERGIES: Cefazolin  Family History  Problem Relation Age of Onset  . Cancer Father [id number]       Dec--metastatic CA lung  . Hypertension Father   .  Hypertension Mother   . Allergic rhinitis Mother   . Migraines Mother   . Cancer Maternal Grandfather        Dec--pancreatic CA  . Asthma Neg Hx   . Eczema Neg Hx   . Urticaria Neg Hx   . Immunodeficiency Neg Hx   . Angioedema Neg Hx     Social History   Socioeconomic History  . Marital status: Married    Spouse name: Not on file  . Number of children: Not on file  . Years of education: Not on file  . Highest education level: Not on file  Occupational History  . Not on file  Social Needs  . Financial resource strain: Not on file  . Food insecurity    Worry: Not on file    Inability: Not on file  . Transportation needs    Medical: Not on file    Non-medical: Not on file  Tobacco Use  . Smoking status: Never  Smoker  . Smokeless tobacco: Never Used  Substance and Sexual Activity  . Alcohol use: No    Alcohol/week: 0.0 standard drinks  . Drug use: No  . Sexual activity: Yes    Partners: Male    Birth control/protection: I.U.D.    Comment: Mirena inserted 06/2014  Lifestyle  . Physical activity    Days per week: Not on file    Minutes per session: Not on file  . Stress: Not on file  Relationships  . Social Herbalist on phone: Not on file    Gets together: Not on file    Attends religious service: Not on file    Active member of club or organization: Not on file    Attends meetings of clubs or organizations: Not on file    Relationship status: Not on file  . Intimate partner violence    Fear of current or ex partner: Not on file    Emotionally abused: Not on file    Physically abused: Not on file    Forced sexual activity: Not on file  Other Topics Concern  . Not on file  Social History Narrative  . Not on file    Review of Systems  Constitutional: Negative.   HENT: Negative.   Eyes: Negative.   Respiratory: Negative.   Cardiovascular: Negative.   Gastrointestinal: Negative.   Endocrine: Negative.   Genitourinary: Negative.   Musculoskeletal: Negative.   Skin: Negative.   Allergic/Immunologic: Negative.   Neurological: Negative.   Hematological: Negative.   Psychiatric/Behavioral: Negative.     PHYSICAL EXAMINATION:    BP 100/68 (BP Location: Right Arm, Patient Position: Sitting, Cuff Size: Large)   Pulse 80   Temp 97.7 F (36.5 C) (Temporal)   Resp 12   Ht 5\' 5"  (1.651 m)   Wt 153 lb (69.4 kg)   LMP  (LMP Unknown)   BMI 25.46 kg/m     General appearance: alert, cooperative and appears stated age  Pelvic US Uterus:  No masses. IUD in endometrial canal.  Ovaries normal.  No free fluid.  ASSESSMENT  Pelvic pain.  Improved with treatment of bacterial vaginosis.  IUD check up.  Mirena IUD in proper location.  Menstrual migraine.  Hx migraine  with aura.  Dark urine.  Dehydration versus medication effect.   PLAN  We discussed her US findings today, and I have her reassurance regarding the IUD position.  We reviewed Mirena IUD and how it does not block ovarian function.  We  reviewed endometriosis as a potential cause of pain.  She will start Vivelle Dot 0.05 mg twice weekly during her menstrual cycle only.  We talked about post vasectomy semen analysis to determine that the procedure was successful.  She will increase her hydration and return if her urine coloration does not return to normal.  FU prn.    An After Visit Summary was printed and given to the patient.  __25____ minutes face to face time of which over 50% was spent in counseling.

## 2018-10-23 NOTE — Progress Notes (Signed)
Encounter reviewed by Dr. Kathrynne Kulinski Amundson C. Silva.  

## 2018-11-27 ENCOUNTER — Telehealth: Payer: Self-pay | Admitting: Obstetrics and Gynecology

## 2018-11-27 NOTE — Telephone Encounter (Signed)
Call to patient. Urodynamics bladder testing sheet reviewed in detail with patient and she verbalized understanding. Is scheduled for urodynamics on 12-03-2018, patient aware to arrive with comfortably full bladder. Pre-procedure UA scheduled for 11-28-2018 at 1015. Urodynamics consult to review results scheduled for 12-11-2018 at 1030. Patient agreeable to date and time of appointments. Aware will receive a copy of form when she comes in for nurse visit on 11-28-2018.   Routing to provider and will close encounter.   Cc Lamont Snowball, RN

## 2018-11-27 NOTE — Telephone Encounter (Signed)
Call placed to follow up with patient in regards to scheduling urodynamics. Patient is ready to proceed with scheduling. Patient is scheduled for urodynamic testing on 12/03/2018 at 11:00 AM. Patient is aware she will receive a call from our nurse for pre-appointment instructions.  Routing to [employer], RN  cc: Lamont Snowball, RN

## 2018-11-28 ENCOUNTER — Ambulatory Visit (INDEPENDENT_AMBULATORY_CARE_PROVIDER_SITE_OTHER): Payer: BC Managed Care – PPO

## 2018-11-28 ENCOUNTER — Other Ambulatory Visit: Payer: Self-pay

## 2018-11-28 VITALS — BP 100/62 | HR 60 | Temp 97.3°F | Ht 64.5 in | Wt 156.4 lb

## 2018-11-28 DIAGNOSIS — Z01812 Encounter for preprocedural laboratory examination: Secondary | ICD-10-CM

## 2018-11-28 DIAGNOSIS — R8290 Unspecified abnormal findings in urine: Secondary | ICD-10-CM

## 2018-11-28 LAB — POCT URINALYSIS DIPSTICK
Bilirubin, UA: NEGATIVE
Glucose, UA: NEGATIVE
Ketones, UA: NEGATIVE
Nitrite, UA: NEGATIVE
Protein, UA: NEGATIVE
Urobilinogen, UA: 0.2 E.U./dL
pH, UA: 6 (ref 5.0–8.0)

## 2018-11-28 NOTE — Progress Notes (Signed)
Patient ID: Christina Black, female   DOB: Sep 07, 1980, 38 y.o.   MRN: [id number]   Patient here for pre-procedural urinalysis for urodynamics.  Urine Dip: 2+ WBCs, Trace RBCs  Patient denies any dysuria or change in urinary symptoms. Will send for urine micro and culture.  Patient has appointment for urodynamics 12-03-18.

## 2018-11-29 LAB — URINALYSIS, MICROSCOPIC ONLY
Casts: NONE SEEN /lpf
Epithelial Cells (non renal): >10 /hpf — AB (ref 0–10)

## 2018-11-30 LAB — URINE CULTURE

## 2018-12-01 ENCOUNTER — Telehealth: Payer: Self-pay | Admitting: Obstetrics and Gynecology

## 2018-12-01 MED ORDER — SULFAMETHOXAZOLE-TRIMETHOPRIM 800-160 MG PO TABS: 1.0000 | ORAL_TABLET | Freq: Two times a day (BID) | ORAL | 0 refills | Status: AC

## 2018-12-01 NOTE — Telephone Encounter (Signed)
Spoke with patient, advised of results as seen below per Dr. Quincy Simmonds. Rx to verified pharmacy. Nurse visit scheduled for 8/21 at 10:15am for repeat urine culture, patient declined earlier appt. Urodynamics testing cancelled for 8/12, consult cancelled for 8/20. Patient verbalizes understanding and is agreeable.   Routing to provider for final review. Patient is agreeable to disposition. Will close encounter.  Cc: Lamont Snowball, RN

## 2018-12-01 NOTE — Telephone Encounter (Signed)
Routing to Dr. Quincy Simmonds to review 11/28/18 UC results.   Patient is scheduled for urodynamics on 12/03/18.   Cc: Lamont Snowball, RN

## 2018-12-01 NOTE — Telephone Encounter (Signed)
She has mixed bacterial flora on her urine culture.  I am recommending treatment with bactrim DS po bid x 3 days. She will need repeat UC prior to doing urodynamic testing.   Cc- Lamont Snowball

## 2018-12-01 NOTE — Telephone Encounter (Signed)
Left message to call Tangie Stay, RN at [facility] [phone].   

## 2018-12-01 NOTE — Telephone Encounter (Signed)
Patient is asking for an update on her recent visit. She said "I have another approximant with Dr.Silva this week pending the results from last weeks test".

## 2018-12-03 ENCOUNTER — Ambulatory Visit: Payer: BC Managed Care – PPO

## 2018-12-10 ENCOUNTER — Other Ambulatory Visit: Payer: Self-pay

## 2018-12-11 ENCOUNTER — Ambulatory Visit: Payer: BC Managed Care – PPO | Admitting: Obstetrics and Gynecology

## 2018-12-12 ENCOUNTER — Ambulatory Visit (INDEPENDENT_AMBULATORY_CARE_PROVIDER_SITE_OTHER): Payer: BC Managed Care – PPO | Admitting: *Deleted

## 2018-12-12 ENCOUNTER — Other Ambulatory Visit: Payer: Self-pay

## 2018-12-12 VITALS — BP 106/62 | HR 60 | Temp 98.0°F | Resp 14 | Ht 65.0 in | Wt 155.4 lb

## 2018-12-12 DIAGNOSIS — R8290 Unspecified abnormal findings in urine: Secondary | ICD-10-CM

## 2018-12-12 NOTE — Progress Notes (Signed)
Patient here for repeat urine culture. Patient states she completed Bactrim as prescribed. States no urinary symptoms today. Clean catch urine provided and sent for culture. Patient advised would be updated when results were back and reviewed by Dr. Quincy Simmonds. Patient agreeable.   Routing to provider and will close encounter.

## 2018-12-13 LAB — URINE CULTURE

## 2018-12-16 ENCOUNTER — Telehealth: Payer: Self-pay | Admitting: *Deleted

## 2018-12-16 NOTE — Telephone Encounter (Signed)
Patient returning call.

## 2018-12-16 NOTE — Telephone Encounter (Signed)
Notes recorded by Burnice Logan, RN on 12/16/2018 at 8:20 AM EDT  Left message to call Sharee Pimple, RN at [facility].

## 2018-12-16 NOTE — Telephone Encounter (Signed)
-----   Message from Nunzio Cobbs, MD sent at 12/15/2018  7:26 PM EDT ----- Please contact patient with negative urine culture result and reschedule her urodynamic testing.

## 2018-12-16 NOTE — Telephone Encounter (Addendum)
Spoke with patient. Advised as seen below per Dr. Quincy Simmonds. Advised Gay Filler is out of the office this week, she will f/u with you when she returns next week to reschedule urodynamics testing. Patient verbalizes understanding and is agreeable.   Routing to S. Orvan Seen, RN to assist with urodynamics scheduling.

## 2018-12-22 ENCOUNTER — Telehealth: Payer: Self-pay | Admitting: Obstetrics and Gynecology

## 2018-12-22 NOTE — Telephone Encounter (Signed)
See next phone encounter. Patient offered urodynamics appointment on 01-07-19 but has now decided she declines to procced.  Encounter closed.

## 2018-12-22 NOTE — Telephone Encounter (Signed)
Spoke with patient.   1. Patient declines to schedule urodynamics at this time. Patient states she would like to wait until next year, does not have enough time off at work.   2. Fasting lab appt scheduled for 12/26/18 at 10:45am. Patient is requesting: glucose, ABO/Rh, Creatinine, Hep B and C, HIV 1 and 2.   3. Advised patient she will need to f/u with PCP for ordering pre-op EKG, patient agreeable.   4. Patient requesting bilateral Dx MMG and Ultrasound. Last bilateral Dx MMG and [place] on 07/29/15 at [facility]. Patient has bilateral breast implants, reports change in shape of right breast, is having silicone implants removed and replaced in 03/2019 in [place]. Patient denies lumps, nipple d/c or skin changes. Advised patient I will have to review with Dr. Quincy Simmonds and return call, patient agreeable.   Dr. Quincy Simmonds -please advise on lab orders and Dx breast imaging.   Cc: Lamont Snowball, RN

## 2018-12-22 NOTE — Telephone Encounter (Signed)
Patient states she is having breast implant surgery in [place] in December and needs lab tests done before then and wants to know if Dr Quincy Simmonds can order the tests needed.The tests are:   EKG MMG and Breast US  Glucose  Blood Type Creatinine HR factor   Hepatitis  HIV   Please advise.

## 2018-12-22 NOTE — Telephone Encounter (Signed)
These tests can be ordered and will count toward her deductible.  The EKG would need to be done at the hospital.

## 2018-12-22 NOTE — Telephone Encounter (Signed)
Routing to Dr. Silva to review and advise on f/u.  

## 2018-12-23 NOTE — Telephone Encounter (Signed)
Please schedule an appointment with me for a breast check.  I can then order diagnostic breast imaging.  She could do the labs on the same day.

## 2018-12-23 NOTE — Telephone Encounter (Signed)
Left message to call Alfred Eckley, RN at [facility] [phone].   

## 2018-12-24 NOTE — Progress Notes (Signed)
GYNECOLOGY  VISIT   HPI: 38 y.o.   Married  [place]  female   [phone] with No LMP recorded. (Menstrual status: IUD).   here for breast check.   She has implants and a fold in the right breast implants.  She feels that the implant is moving to the right, and it is uncomfortable.   She is going to travel to [place] at the end of the year.   GYNECOLOGIC HISTORY: No LMP recorded. (Menstrual status: IUD). Contraception: Mirena IUD 06/2014 Menopausal hormone therapy:  n/a Last mammogram:  07/29/15 Diagnostic Bilateral MMG/Right Breast US BIRADS 2 benign/density c Last pap smear: 07/30/17 Neg:Neg HR HPV        OB History    Gravida  3   Para  2   Term  2   Preterm  0   AB  1   Living  2     SAB  1   TAB  0   Ectopic  0   Multiple  0   Live Births  2              Patient Active Problem List   Diagnosis Date Noted  . Seasonal allergic rhinitis 11/29/2016  . Allergic conjunctivitis 11/29/2016  . History of food allergy 11/29/2016  . History of wheezing 11/29/2016  . Seasonal allergies 11/13/2016  . Tenosynovitis, de Quervain 11/13/2016  . Stress incontinence 10/22/2014  . VBAC, delivered, current hospitalization 05/10/2014  . IDA (iron deficiency anemia) 05/10/2014  . Dependent edema 05/10/2014  . Maternal care for scar from previous cesarean delivery 05/10/2014  . Postpartum state 05/08/2014  . Status post vacuum-assisted vaginal delivery (1/15) 05/08/2014  . Gestational hypertension without significant proteinuria in third trimester 05/07/2014    Past Medical History:  Diagnosis Date  . Elevated hemoglobin A1c 2017   Level - 5.7  . Gestational diabetes   . Gestational diabetes   . Low vitamin D level   . Medical history non-contributory   . Migraine headache with aura   . Pregnancy induced hypertension   . Status post vacuum-assisted vaginal delivery (1/15) 05/08/2014    Past Surgical History:  Procedure Laterality Date  . AUGMENTATION MAMMAPLASTY   8366   Silicone in [place]  . BREAST ENHANCEMENT SURGERY    . CESAREAN SECTION  01-29-09   in [place]  . COSMETIC SURGERY  2012   liposuction--[place]    Current Outpatient Medications  Medication Sig Dispense Refill  . Cholecalciferol (VITAMIN D) 2000 units CAPS Take by mouth.    . levocetirizine (XYZAL) 5 MG tablet Take 1 tablet (5 mg total) by mouth every evening. 30 tablet 5  . levonorgestrel (MIRENA) 20 MCG/24HR IUD by Intrauterine route.     No current facility-administered medications for this visit.      ALLERGIES: Cefazolin  Family History  Problem Relation Age of Onset  . Cancer Father 52       Dec--metastatic CA lung  . Hypertension Father   . Hypertension Mother   . Allergic rhinitis Mother   . Migraines Mother   . Cancer Maternal Grandfather        Dec--pancreatic CA  . Asthma Neg Hx   . Eczema Neg Hx   . Urticaria Neg Hx   . Immunodeficiency Neg Hx   . Angioedema Neg Hx     Social History   Socioeconomic History  . Marital status: Married    Spouse name: Not on file  . Number of children: Not on  file  . Years of education: Not on file  . Highest education level: Not on file  Occupational History  . Not on file  Social Needs  . Financial resource strain: Not on file  . Food insecurity    Worry: Not on file    Inability: Not on file  . Transportation needs    Medical: Not on file    Non-medical: Not on file  Tobacco Use  . Smoking status: Never Smoker  . Smokeless tobacco: Never Used  Substance and Sexual Activity  . Alcohol use: No    Alcohol/week: 0.0 standard drinks  . Drug use: No  . Sexual activity: Yes    Partners: Male    Birth control/protection: I.U.D.    Comment: Mirena inserted 06/2014  Lifestyle  . Physical activity    Days per week: Not on file    Minutes per session: Not on file  . Stress: Not on file  Relationships  . Social Musicianconnections    Talks on phone: Not on file    Gets together: Not on file    Attends religious  service: Not on file    Active member of club or organization: Not on file    Attends meetings of clubs or organizations: Not on file    Relationship status: Not on file  . Intimate partner violence    Fear of current or ex partner: Not on file    Emotionally abused: Not on file    Physically abused: Not on file    Forced sexual activity: Not on file  Other Topics Concern  . Not on file  Social History Narrative  . Not on file    Review of Systems  All other systems reviewed and are negative.   PHYSICAL EXAMINATION:    BP 102/64   Pulse (!) 56   Temp (!) 97.2 F (36.2 C) (Temporal)   Ht 5' 4.5" (1.638 m)   Wt 154 lb 6.4 oz (70 kg)   BMI 26.09 kg/m     General appearance: alert, cooperative and appears stated age  Breasts: bilateral implants.   Left breast - normal appearance, no masses or tenderness, No nipple retraction or dimpling, No nipple discharge or bleeding, No axillary or supraclavicular adenopathy Right breast - bubble like fold of the implant at the 12:00 - 3:00 position. The implant also falls onto  her right side when she is lying down.  No dominant masses, nipple masses or discharge, or axillary adenopathy.   Chaperone was present for exam.   ASSESSMENT  Bilateral breast implants and fold in right implant with displacement.   PLAN  Plan for dx bilateral mammogram and right breast US.  She will do her preop labs here today:  CBC type and screen, hep B and C, HIV, BMP.  She will follow up with Dr. Riley NearingAguiar prior to her travel to [place] at the end of the year in order to get her EKG done here.  I wish her well with her surgery and travel. Fu prn.    An After Visit Summary was printed and given to the patient.  __15____ minutes face to face time of which over 50% was spent in counseling.

## 2018-12-24 NOTE — Telephone Encounter (Signed)
Patient returned call. Message given to patient as seen below from Dr. Quincy Simmonds. Patient scheduled for fasting labs and breast check on 12-30-2018 at 1030. Patient agreeable to date and time of appointment.   Routing to provider and will close encounter.

## 2018-12-24 NOTE — Telephone Encounter (Signed)
Patient returned call

## 2018-12-24 NOTE — Telephone Encounter (Signed)
Message left to return call to Triage Nurse at [phone].    

## 2018-12-25 ENCOUNTER — Other Ambulatory Visit: Payer: Self-pay

## 2018-12-26 ENCOUNTER — Ambulatory Visit: Payer: BC Managed Care – PPO | Admitting: Obstetrics and Gynecology

## 2018-12-26 ENCOUNTER — Other Ambulatory Visit: Payer: BC Managed Care – PPO

## 2018-12-30 ENCOUNTER — Telehealth: Payer: Self-pay | Admitting: Obstetrics and Gynecology

## 2018-12-30 ENCOUNTER — Encounter: Payer: Self-pay | Admitting: Obstetrics and Gynecology

## 2018-12-30 ENCOUNTER — Ambulatory Visit: Payer: BC Managed Care – PPO | Admitting: Obstetrics and Gynecology

## 2018-12-30 ENCOUNTER — Other Ambulatory Visit: Payer: Self-pay

## 2018-12-30 VITALS — BP 102/64 | HR 56 | Temp 97.2°F | Ht 64.5 in | Wt 154.4 lb

## 2018-12-30 DIAGNOSIS — Z01818 Encounter for other preprocedural examination: Secondary | ICD-10-CM

## 2018-12-30 DIAGNOSIS — T8549XA Other mechanical complication of breast prosthesis and implant, initial encounter: Secondary | ICD-10-CM | POA: Diagnosis not present

## 2018-12-30 NOTE — Telephone Encounter (Signed)
Spoke with patient, advised of appt as seen below at [facility]. Patient is agreeable to date and time.   Routing to provider for final review. Patient is agreeable to disposition. Will close encounter.

## 2018-12-30 NOTE — Telephone Encounter (Signed)
Spoke with Christina Black at [facility]. Patient is scheduled for bilateral Dx MMG and right breast US, if needed, on 01/07/19 at 10am, arrive at 9:40am.

## 2018-12-30 NOTE — Telephone Encounter (Signed)
Please contact patient to schedule a bilateral dx mammogram and right breast US.   She has bilateral implants and has a "fold" in the right breast implant.  The implants is also falling to her right side.   She is planning surgical care in [place] at the end of the year.   She will need a copy of the report as well as a CD or DVD with the images to take with her to [place].

## 2018-12-31 LAB — HEPATITIS B SURFACE ANTIGEN: Hepatitis B Surface Ag: NEGATIVE

## 2018-12-31 LAB — BASIC METABOLIC PANEL
BUN/Creatinine Ratio: 14 (ref 9–23)
BUN: 11 mg/dL (ref 6–20)
CO2: 24 mmol/L (ref 20–29)
Calcium: 9.5 mg/dL (ref 8.7–10.2)
Chloride: 102 mmol/L (ref 96–106)
Creatinine, Ser: 0.79 mg/dL (ref 0.57–1.00)
GFR calc Af Amer: 111 mL/min/{1.73_m2} (ref >59)
GFR calc non Af Amer: 96 mL/min/{1.73_m2} (ref >59)
Glucose: 93 mg/dL (ref 65–99)
Potassium: 4.2 mmol/L (ref 3.5–5.2)
Sodium: 138 mmol/L (ref 134–144)

## 2018-12-31 LAB — CBC
Hematocrit: 38.6 % (ref 34.0–46.6)
Hemoglobin: 13.3 g/dL (ref 11.1–15.9)
MCH: 31 pg (ref 26.6–33.0)
MCHC: 34.5 g/dL (ref 31.5–35.7)
MCV: 90 fL (ref 79–97)
Platelets: 255 10*3/uL (ref 150–450)
RBC: 4.29 x10E6/uL (ref 3.77–5.28)
RDW: 12.4 % (ref 11.7–15.4)
WBC: 7.2 10*3/uL (ref 3.4–10.8)

## 2018-12-31 LAB — HIV ANTIBODY (ROUTINE TESTING W REFLEX): HIV Screen 4th Generation wRfx: NONREACTIVE

## 2018-12-31 LAB — HEPATITIS C ANTIBODY: Hep C Virus Ab: <0.1 s/co ratio (ref 0.0–0.9)

## 2019-01-07 ENCOUNTER — Other Ambulatory Visit: Payer: Self-pay | Admitting: Obstetrics and Gynecology

## 2019-01-07 ENCOUNTER — Ambulatory Visit
Admission: RE | Admit: 2019-01-07 | Discharge: 2019-01-07 | Disposition: A | Discharge status: Home / Self Care | Payer: No Typology Code available for payment source | Source: Ambulatory Visit | Attending: Obstetrics and Gynecology | Admitting: Obstetrics and Gynecology

## 2019-01-07 ENCOUNTER — Other Ambulatory Visit: Payer: Self-pay

## 2019-01-07 DIAGNOSIS — T8549XA Other mechanical complication of breast prosthesis and implant, initial encounter: Secondary | ICD-10-CM

## 2019-03-08 ENCOUNTER — Encounter: Payer: Self-pay | Admitting: Obstetrics and Gynecology

## 2019-03-09 ENCOUNTER — Telehealth: Payer: Self-pay | Admitting: Obstetrics and Gynecology

## 2019-03-09 NOTE — Telephone Encounter (Signed)
Spoke with pt. Pt wanting to know Blood type from labs in September. From previous labs on 05/07/2014, A. POS. Pt verbalized understanding and will find on mychart.  Will close encounter.

## 2019-03-09 NOTE — Telephone Encounter (Signed)
Patient sent the following correspondence through [facility].    I haven't receive my blood type test result yet, can you check? thanks

## 2019-04-23 ENCOUNTER — Encounter

## 2019-05-08 ENCOUNTER — Telehealth: Payer: Self-pay | Admitting: Obstetrics and Gynecology

## 2019-05-08 DIAGNOSIS — Z30433 Encounter for removal and reinsertion of intrauterine contraceptive device: Secondary | ICD-10-CM

## 2019-05-08 NOTE — Telephone Encounter (Signed)
Patient would like appointment for iud removal and insertion.

## 2019-05-08 NOTE — Telephone Encounter (Signed)
Spoke with pt. Pt wanting to make OV for IUD exchange. Pt said its expired. Pt requesting appt first week of Feb and in afternoon. Pt scheduled for IUD exchange on 05/25/2019 at 4pm. 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 Pt verbalized understanding and is agreeable. Pt instructed to take Motrin 800 mg with food and water one hour before procedure. Pt agreeable.   Will route to Dr Edward Jolly for review and will close encounter. Cc: Zachery Dauer for precert. Orders placed.

## 2019-05-12 ENCOUNTER — Telehealth: Payer: Self-pay | Admitting: Obstetrics and Gynecology

## 2019-05-12 NOTE — Telephone Encounter (Signed)
Call placed to convey benefits for IUD exchange. Spoke with the patient and conveyed the benefits. Patient understands/agreeable with the benefits. Patient is aware of the cancellation policy. Appointment scheduled 05/25/19

## 2019-05-25 ENCOUNTER — Ambulatory Visit (INDEPENDENT_AMBULATORY_CARE_PROVIDER_SITE_OTHER): Payer: BC Managed Care – PPO | Admitting: Obstetrics and Gynecology

## 2019-05-25 ENCOUNTER — Encounter: Payer: Self-pay | Admitting: Obstetrics and Gynecology

## 2019-05-25 ENCOUNTER — Other Ambulatory Visit: Payer: Self-pay

## 2019-05-25 VITALS — BP 110/78 | HR 70 | Temp 97.2°F | Ht 64.5 in | Wt 162.6 lb

## 2019-05-25 DIAGNOSIS — Z01812 Encounter for preprocedural laboratory examination: Secondary | ICD-10-CM | POA: Diagnosis not present

## 2019-05-25 DIAGNOSIS — Z30433 Encounter for removal and reinsertion of intrauterine contraceptive device: Secondary | ICD-10-CM

## 2019-05-25 LAB — POCT URINE PREGNANCY: Preg Test, Ur: NEGATIVE

## 2019-05-25 NOTE — Progress Notes (Signed)
GYNECOLOGY  VISIT   HPI: 39 y.o.   Married  [place]  female   [phone] with No LMP recorded. (Menstrual status: IUD).   here for Mirena IUD exchange.   Did breast implant exchange in [place] in December, 2020.   No menses with Mirena.   UPT - neg.   GYNECOLOGIC HISTORY: No LMP recorded. (Menstrual status: IUD). Contraception:  Mirena IUD 06/2014 Menopausal hormone therapy:  n/a Last mammogram dx:  BI-RADS1. Last pap smear:   10/14/18 - negative, Neg HR HPV.        OB History    Gravida  3   Para  2   Term  2   Preterm  0   AB  1   Living  2     SAB  1   TAB  0   Ectopic  0   Multiple  0   Live Births  2              Patient Active Problem List   Diagnosis Date Noted  . Seasonal allergic rhinitis 11/29/2016  . Allergic conjunctivitis 11/29/2016  . History of food allergy 11/29/2016  . History of wheezing 11/29/2016  . Seasonal allergies 11/13/2016  . Tenosynovitis, de Quervain 11/13/2016  . Stress incontinence 10/22/2014  . VBAC, delivered, current hospitalization 05/10/2014  . IDA (iron deficiency anemia) 05/10/2014  . Dependent edema 05/10/2014  . Maternal care for scar from previous cesarean delivery 05/10/2014  . Postpartum state 05/08/2014  . Status post vacuum-assisted vaginal delivery (1/15) 05/08/2014  . Gestational hypertension without significant proteinuria in third trimester 05/07/2014    Past Medical History:  Diagnosis Date  . Elevated hemoglobin A1c 2017   Level - 5.7  . Gestational diabetes   . Gestational diabetes   . Low vitamin D level   . Medical history non-contributory   . Migraine headache with aura   . Pregnancy induced hypertension   . Status post vacuum-assisted vaginal delivery (1/15) 05/08/2014    Past Surgical History:  Procedure Laterality Date  . AUGMENTATION MAMMAPLASTY  8101   Silicone in [place]  . BREAST ENHANCEMENT SURGERY    . CESAREAN SECTION  01-29-09   in [place]  . COSMETIC SURGERY  2012    liposuction--[place]    Current Outpatient Medications  Medication Sig Dispense Refill  . Cholecalciferol (VITAMIN D) 2000 units CAPS Take by mouth.    . levocetirizine (XYZAL) 5 MG tablet Take 1 tablet (5 mg total) by mouth every evening. 30 tablet 5  . levonorgestrel (MIRENA) 20 MCG/24HR IUD by Intrauterine route.     No current facility-administered medications for this visit.     ALLERGIES: Cefazolin  Family History  Problem Relation Age of Onset  . Cancer Father 56       Dec--metastatic CA lung  . Hypertension Father   . Hypertension Mother   . Allergic rhinitis Mother   . Migraines Mother   . Cancer Maternal Grandfather        Dec--pancreatic CA  . Asthma Neg Hx   . Eczema Neg Hx   . Urticaria Neg Hx   . Immunodeficiency Neg Hx   . Angioedema Neg Hx     Social History   Socioeconomic History  . Marital status: Married    Spouse name: Not on file  . Number of children: Not on file  . Years of education: Not on file  . Highest education level: Not on file  Occupational History  . Not on  file  Tobacco Use  . Smoking status: Never Smoker  . Smokeless tobacco: Never Used  Substance and Sexual Activity  . Alcohol use: No    Alcohol/week: 0.0 standard drinks  . Drug use: No  . Sexual activity: Yes    Partners: Male    Birth control/protection: I.U.D.    Comment: Mirena inserted 06/2014  Other Topics Concern  . Not on file  Social History Narrative  . Not on file   Social Determinants of Health   Financial Resource Strain:   . Difficulty of Paying Living Expenses: Not on file  Food Insecurity:   . Worried About Programme researcher, broadcasting/film/video in the Last Year: Not on file  . Ran Out of Food in the Last Year: Not on file  Transportation Needs:   . Lack of Transportation (Medical): Not on file  . Lack of Transportation (Non-Medical): Not on file  Physical Activity:   . Days of Exercise per Week: Not on file  . Minutes of Exercise per Session: Not on file  Stress:    . Feeling of Stress : Not on file  Social Connections:   . Frequency of Communication with Friends and Family: Not on file  . Frequency of Social Gatherings with Friends and Family: Not on file  . Attends Religious Services: Not on file  . Active Member of Clubs or Organizations: Not on file  . Attends Banker Meetings: Not on file  . Marital Status: Not on file  Intimate Partner Violence:   . Fear of Current or Ex-Partner: Not on file  . Emotionally Abused: Not on file  . Physically Abused: Not on file  . Sexually Abused: Not on file    Review of Systems  All other systems reviewed and are negative.   PHYSICAL EXAMINATION:    BP 110/78   Pulse 70   Temp (!) 97.2 F (36.2 C) (Temporal)   Ht 5' 4.5" (1.638 m)   Wt 162 lb 9.6 oz (73.8 kg)   BMI 27.48 kg/m     General appearance: alert, cooperative and appears stated age   Pelvic: External genitalia:  no lesions              Urethra:  normal appearing urethra with no masses, tenderness or lesions              Bartholins and Skenes: normal                 Vagina: normal appearing vagina with normal color and discharge, no lesions              Cervix: no lesions                Bimanual Exam:  Uterus:  normal size, contour, position, consistency, mobility, non-tender              Adnexa: no mass, fullness, tenderness         Mirena IUD exchange.  Consent for procedures.  New Mirena IUD - lot [id number], expiration Feb 2023.  Sterile prep with Hibiclens. IUD removed with ring forceps, intact, shown to patient, and then discarded. Paracervical block with 10 cc 1% lidocaine, lot [id number], expiration 4/22. Tenaculum to anterior cervical lip.  Uterus sounded to 7 cm.  Mirena IUD placed without difficulty.  Strings trimmed.  Silver nitrate to tenaculum site on anterior cervical lip.  Minimal ELB.  No complications.  Repeat BM exam - no change.   Chaperone was present  for exam.  ASSESSMENT  Mirena IUD  exchange. Hx Cesarean Section.   PLAN  Post IUD instructions given. IUD card to patient.  Back up protection for one week.  FU in 4 weeks.    An After Visit Summary was printed and given to the patient.

## 2019-05-25 NOTE — Patient Instructions (Signed)
Intrauterine Device Insertion, Care After  This sheet gives you information about how to care for yourself after your procedure. Your health care provider may also give you more specific instructions. If you have problems or questions, contact your health care provider. What can I expect after the procedure? After the procedure, it is common to have:  Cramps and pain in the abdomen.  Light bleeding (spotting) or heavier bleeding that is like your menstrual period. This may last for up to a few days.  Lower back pain.  Dizziness.  Headaches.  Nausea. Follow these instructions at home:  Before resuming sexual activity, check to make sure that you can feel the IUD string(s). You should be able to feel the end of the string(s) below the opening of your cervix. If your IUD string is in place, you may resume sexual activity. ? If you had a hormonal IUD inserted more than 7 days after your most recent period started, you will need to use a backup method of birth control for 7 days after IUD insertion. Ask your health care provider whether this applies to you.  Continue to check that the IUD is still in place by feeling for the string(s) after every menstrual period, or once a month.  Take over-the-counter and prescription medicines only as told by your health care provider.  Do not drive or use heavy machinery while taking prescription pain medicine.  Keep all follow-up visits as told by your health care provider. This is important. Contact a health care provider if:  You have bleeding that is heavier or lasts longer than a normal menstrual cycle.  You have a fever.  You have cramps or abdominal pain that get worse or do not get better with medicine.  You develop abdominal pain that is new or is not in the same area of earlier cramping and pain.  You feel lightheaded or weak.  You have abnormal or bad-smelling discharge from your vagina.  You have pain during sexual  activity.  You have any of the following problems with your IUD string(s): ? The string bothers or hurts you or your sexual partner. ? You cannot feel the string. ? The string has gotten longer.  You can feel the IUD in your vagina.  You think you may be pregnant, or you miss your menstrual period.  You think you may have an STI (sexually transmitted infection). Get help right away if:  You have flu-like symptoms.  You have a fever and chills.  You can feel that your IUD has slipped out of place. Summary  After the procedure, it is common to have cramps and pain in the abdomen. It is also common to have light bleeding (spotting) or heavier bleeding that is like your menstrual period.  Continue to check that the IUD is still in place by feeling for the string(s) after every menstrual period, or once a month.  Keep all follow-up visits as told by your health care provider. This is important.  Contact your health care provider if you have problems with your IUD string(s), such as the string getting longer or bothering you or your sexual partner. This information is not intended to replace advice given to you by your health care provider. Make sure you discuss any questions you have with your health care provider. Document Revised: 03/22/2017 Document Reviewed: 02/29/2016 Elsevier Patient Education  2020 [employer]  

## 2019-06-19 ENCOUNTER — Other Ambulatory Visit: Payer: Self-pay

## 2019-06-23 ENCOUNTER — Ambulatory Visit: Payer: BC Managed Care – PPO | Admitting: Obstetrics and Gynecology

## 2019-06-23 ENCOUNTER — Other Ambulatory Visit: Payer: Self-pay

## 2019-06-23 ENCOUNTER — Encounter: Payer: Self-pay | Admitting: Obstetrics and Gynecology

## 2019-06-23 VITALS — BP 100/62 | HR 70 | Temp 97.0°F | Ht 64.5 in | Wt 162.0 lb

## 2019-06-23 DIAGNOSIS — Z30431 Encounter for routine checking of intrauterine contraceptive device: Secondary | ICD-10-CM | POA: Diagnosis not present

## 2019-06-23 NOTE — Progress Notes (Signed)
GYNECOLOGY  VISIT   HPI: 39 y.o.   Married  [place]  female   [phone] with No LMP recorded. (Menstrual status: IUD).   here for 4 week follow up after Mirena IUD insertion.   Cramping after placing the IUD.   No menses.   No pain with intercourse.   GYNECOLOGIC HISTORY: No LMP recorded. (Menstrual status: IUD). Contraception:  Mirena IUD 05-25-19 Menopausal hormone therapy:  n/a Last mammogram: 01-07-19 Silicone implants/Bil.Diag w/Rt.Br.US/Neg--no evidence of extracapsular silicone/density C/BiRads1/screening age 19/consultation with plastic surgeon rec.for concerns Last pap smear: 10-14-18 Neg:Neg HR HPV, 07/30/17 Neg:Neg HR HPV        OB History    Gravida  3   Para  2   Term  2   Preterm  0   AB  1   Living  2     SAB  1   TAB  0   Ectopic  0   Multiple  0   Live Births  2              Patient Active Problem List   Diagnosis Date Noted  . Seasonal allergic rhinitis 11/29/2016  . Allergic conjunctivitis 11/29/2016  . History of food allergy 11/29/2016  . History of wheezing 11/29/2016  . Seasonal allergies 11/13/2016  . Tenosynovitis, de Quervain 11/13/2016  . Stress incontinence 10/22/2014  . VBAC, delivered, current hospitalization 05/10/2014  . IDA (iron deficiency anemia) 05/10/2014  . Dependent edema 05/10/2014  . Maternal care for scar from previous cesarean delivery 05/10/2014  . Postpartum state 05/08/2014  . Status post vacuum-assisted vaginal delivery (1/15) 05/08/2014  . Gestational hypertension without significant proteinuria in third trimester 05/07/2014    Past Medical History:  Diagnosis Date  . Elevated hemoglobin A1c 2017   Level - 5.7  . Gestational diabetes   . Gestational diabetes   . Low vitamin D level   . Medical history non-contributory   . Migraine headache with aura   . Pregnancy induced hypertension   . Status post vacuum-assisted vaginal delivery (1/15) 05/08/2014    Past Surgical History:  Procedure Laterality  Date  . AUGMENTATION MAMMAPLASTY  2012   Silicone in [place]  . BREAST ENHANCEMENT SURGERY    . CESAREAN SECTION  01-29-09   in [place]  . COSMETIC SURGERY  2012   liposuction--[place]    Current Outpatient Medications  Medication Sig Dispense Refill  . Cholecalciferol (VITAMIN D) 2000 units CAPS Take by mouth.    . levocetirizine (XYZAL) 5 MG tablet Take 1 tablet (5 mg total) by mouth every evening. 30 tablet 5  . levonorgestrel (MIRENA) 20 MCG/24HR IUD by Intrauterine route.     No current facility-administered medications for this visit.     ALLERGIES: Cefazolin  Family History  Problem Relation Age of Onset  . Cancer Father 2       Dec--metastatic CA lung  . Hypertension Father   . Hypertension Mother   . Allergic rhinitis Mother   . Migraines Mother   . Cancer Maternal Grandfather        Dec--pancreatic CA  . Asthma Neg Hx   . Eczema Neg Hx   . Urticaria Neg Hx   . Immunodeficiency Neg Hx   . Angioedema Neg Hx     Social History   Socioeconomic History  . Marital status: Married    Spouse name: Not on file  . Number of children: Not on file  . Years of education: Not on file  .  Highest education level: Not on file  Occupational History  . Not on file  Tobacco Use  . Smoking status: Never Smoker  . Smokeless tobacco: Never Used  Substance and Sexual Activity  . Alcohol use: No    Alcohol/week: 0.0 standard drinks  . Drug use: No  . Sexual activity: Yes    Partners: Male    Birth control/protection: I.U.D.    Comment: Mirena inserted 06/2014  Other Topics Concern  . Not on file  Social History Narrative  . Not on file   Social Determinants of Health   Financial Resource Strain:   . Difficulty of Paying Living Expenses: Not on file  Food Insecurity:   . Worried About Charity fundraiser in the Last Year: Not on file  . Ran Out of Food in the Last Year: Not on file  Transportation Needs:   . Lack of Transportation (Medical): Not on file  . Lack of  Transportation (Non-Medical): Not on file  Physical Activity:   . Days of Exercise per Week: Not on file  . Minutes of Exercise per Session: Not on file  Stress:   . Feeling of Stress : Not on file  Social Connections:   . Frequency of Communication with Friends and Family: Not on file  . Frequency of Social Gatherings with Friends and Family: Not on file  . Attends Religious Services: Not on file  . Active Member of Clubs or Organizations: Not on file  . Attends Archivist Meetings: Not on file  . Marital Status: Not on file  Intimate Partner Violence:   . Fear of Current or Ex-Partner: Not on file  . Emotionally Abused: Not on file  . Physically Abused: Not on file  . Sexually Abused: Not on file    Review of Systems  All other systems reviewed and are negative.   PHYSICAL EXAMINATION:    BP 100/62   Pulse 70   Temp (!) 97 F (36.1 C) (Temporal)   Ht 5' 4.5" (1.638 m)   Wt 162 lb (73.5 kg)   BMI 27.38 kg/m     General appearance: alert, cooperative and appears stated age  Pelvic: External genitalia:  no lesions              Urethra:  normal appearing urethra with no masses, tenderness or lesions              Bartholins and Skenes: normal                 Vagina: normal appearing vagina with normal color and discharge, no lesions              Cervix: no lesions.  IUD strings noted.                 Bimanual Exam:  Uterus:  normal size, contour, position, consistency, mobility, non-tender              Adnexa: no mass, fullness, tenderness                Chaperone was present for exam.  ASSESSMENT  IUD check up.  PLAN  Reassurance regarding Mirena IUD position.  We discussed 6 year usage of Mirena.  FU for annual exam in July, 2021 and prn.   An After Visit Summary was printed and given to the patient.  __10____ minutes face to face time of which over 50% was spent in counseling.

## 2019-10-21 NOTE — Progress Notes (Signed)
39 y.o. G63P2012 Married [place] female here for annual exam.    Doing well with Mirena.  No menses and really happy with this.   She is reporting decreased libido and friends in [place] are using a product in to increase libido.   Not sleeping well.  Thinking about work.   Had an episode of back pain.  She did an ultrasound, which was normal.  The pain resolved.   Has low vit D and advised to take 2000 IU daily by PCP.   Did her Covid vaccine.   PCP:  Bernadette Hoit, MD   No LMP recorded. (Menstrual status: IUD).     Period Cycle (Days):  (no cycle with Mirena IUD)     Sexually active: Yes.    The current method of family planning is IUD--Mirena 05-25-19.    Exercising: Yes.    works out at gym 3x/week Smoker:  no  Health Maintenance: Pap: 10-14-18 Neg:Neg HR HPV, 07/30/17 Neg:Neg HR HPV History of abnormal Pap:  no MMG:  01-07-19 Diag.Bil/Implants w/Rt.Br.US/Neg/density C/BiRads1/screening age 39 Colonoscopy:  n/a BMD:   n/a  Result  n/a TDaP:  2016 Gardasil:   yes HIV: 12-30-18 NR Hep C: 12-30-18 Neg Screening Labs:  PCP.    reports that she has never smoked. She has never used smokeless tobacco. She reports that she does not drink alcohol and does not use drugs.  Past Medical History:  Diagnosis Date  . Elevated hemoglobin A1c 2017   Level - 5.7  . Gestational diabetes   . Gestational diabetes   . Low vitamin D level   . Medical history non-contributory   . Migraine headache with aura   . Pregnancy induced hypertension   . Status post vacuum-assisted vaginal delivery (1/15) 05/08/2014    Past Surgical History:  Procedure Laterality Date  . AUGMENTATION MAMMAPLASTY  2012   Silicone in [place]  . BREAST ENHANCEMENT SURGERY    . CESAREAN SECTION  01-29-09   in [place]  . COSMETIC SURGERY  2012   liposuction--[place]    Current Outpatient Medications  Medication Sig Dispense Refill  . levocetirizine (XYZAL) 5 MG tablet Take 1 tablet (5 mg total) by mouth every  evening. 30 tablet 5  . levonorgestrel (MIRENA) 20 MCG/24HR IUD by Intrauterine route.     No current facility-administered medications for this visit.    Family History  Problem Relation Age of Onset  . Cancer Father 24       Dec--metastatic CA lung  . Hypertension Father   . Hypertension Mother   . Allergic rhinitis Mother   . Migraines Mother   . Cancer Maternal Grandfather        Dec--pancreatic CA  . Asthma Neg Hx   . Eczema Neg Hx   . Urticaria Neg Hx   . Immunodeficiency Neg Hx   . Angioedema Neg Hx     Review of Systems  All other systems reviewed and are negative.   Exam:   BP 110/66   Pulse (!) 56   Resp 16   Ht 5' 5.5" (1.664 m)   Wt 165 lb (74.8 kg)   BMI 27.04 kg/m     General appearance: alert, cooperative and appears stated age Head: normocephalic, without obvious abnormality, atraumatic Neck: no adenopathy, supple, symmetrical, trachea midline and thyroid normal to inspection and palpation Lungs: clear to auscultation bilaterally Breasts: scars and implants bilaterally, no masses or tenderness, No nipple retraction or dimpling, No nipple discharge or bleeding, No axillary  adenopathy.  Left areola with eschar.  No mass. Heart: regular rate and rhythm Abdomen: soft, non-tender; no masses, no organomegaly Extremities: extremities normal, atraumatic, no cyanosis or edema Skin: skin color, texture, turgor normal. No rashes or lesions Lymph nodes: cervical, supraclavicular, and axillary nodes normal. Neurologic: grossly normal  Pelvic: External genitalia:  no lesions              No abnormal inguinal nodes palpated.              Urethra:  normal appearing urethra with no masses, tenderness or lesions              Bartholins and Skenes: normal                 Vagina: normal appearing vagina with normal color and discharge, no lesions              Cervix: no lesions              Pap taken: No. Bimanual Exam:  Uterus:  normal size, contour, position,  consistency, mobility, non-tender              Adnexa: no mass, fullness, tenderness              Rectal exam: Yes.  .  Confirms.              Anus:  normal sphincter tone, no lesions  Chaperone was present for exam.  Assessment:   Well woman visit with normal exam. Mirena IUD.  Bilateral breast augmentation.  Migraine with aura. Decreased libido.  Plan: Mammogram screening discussed. Self breast awareness reviewed. Pap and HR HPV as above. Guidelines for Calcium, Vitamin D, regular exercise program including cardiovascular and weight bearing exercise. I discussed Testosterone and Wellbutrin for treatment of decreased libido.  Will start Wellbutrin XL 150 mg po q day and have follow up in 6 weeks.  Labs with PCP.  Follow up annually and prn.   After visit summary provided.

## 2019-10-28 ENCOUNTER — Other Ambulatory Visit: Payer: Self-pay

## 2019-10-28 ENCOUNTER — Ambulatory Visit (INDEPENDENT_AMBULATORY_CARE_PROVIDER_SITE_OTHER): Payer: BC Managed Care – PPO | Admitting: Obstetrics and Gynecology

## 2019-10-28 ENCOUNTER — Encounter: Payer: Self-pay | Admitting: Obstetrics and Gynecology

## 2019-10-28 VITALS — BP 110/66 | HR 56 | Resp 16 | Ht 65.5 in | Wt 165.0 lb

## 2019-10-28 DIAGNOSIS — R6882 Decreased libido: Secondary | ICD-10-CM

## 2019-10-28 DIAGNOSIS — Z01419 Encounter for gynecological examination (general) (routine) without abnormal findings: Secondary | ICD-10-CM

## 2019-10-28 MED ORDER — BUPROPION HCL ER (XL) 150 MG PO TB24: 150.0000 mg | ORAL_TABLET | Freq: Every day | ORAL | 1 refills | Status: DC

## 2019-10-28 NOTE — Patient Instructions (Signed)

## 2019-12-11 ENCOUNTER — Telehealth: Payer: Self-pay | Admitting: Obstetrics and Gynecology

## 2019-12-11 NOTE — Telephone Encounter (Signed)
Encounter reviewed and closed.  

## 2019-12-11 NOTE — Telephone Encounter (Signed)
Patient cancelled 6 week recheck and will call back to reschedule. °

## 2019-12-15 ENCOUNTER — Ambulatory Visit: Payer: BC Managed Care – PPO | Admitting: Obstetrics and Gynecology

## 2020-07-14 IMAGING — US US BREAST*R* LIMITED INC AXILLA
1 series · 5 of 5 positions shown · non-contrast
Comparison: Previous exam(s).

CLINICAL DATA: 37-year-old female with history of silicone implants
initially placed in [place] in 8388. She complains of a fold in the
implant in the upper inner right side when raising her arm, which
has been occurring since 2201. She also states that the right breast
implant is moving to the right.

EXAM:
2D DIGITAL DIAGNOSTIC BILATERAL MAMMOGRAM WITH IMPLANTS, CAD AND
ADJUNCT TOMO
The patient has retroglandular implants. Standard and implant
displaced views were performed.
RIGHT BREAST ULTRASOUND

[Series 1: us breast*right* limited inc axilla · 0.06mm/px · 5 of 5 slices shown]
[im 1/5]
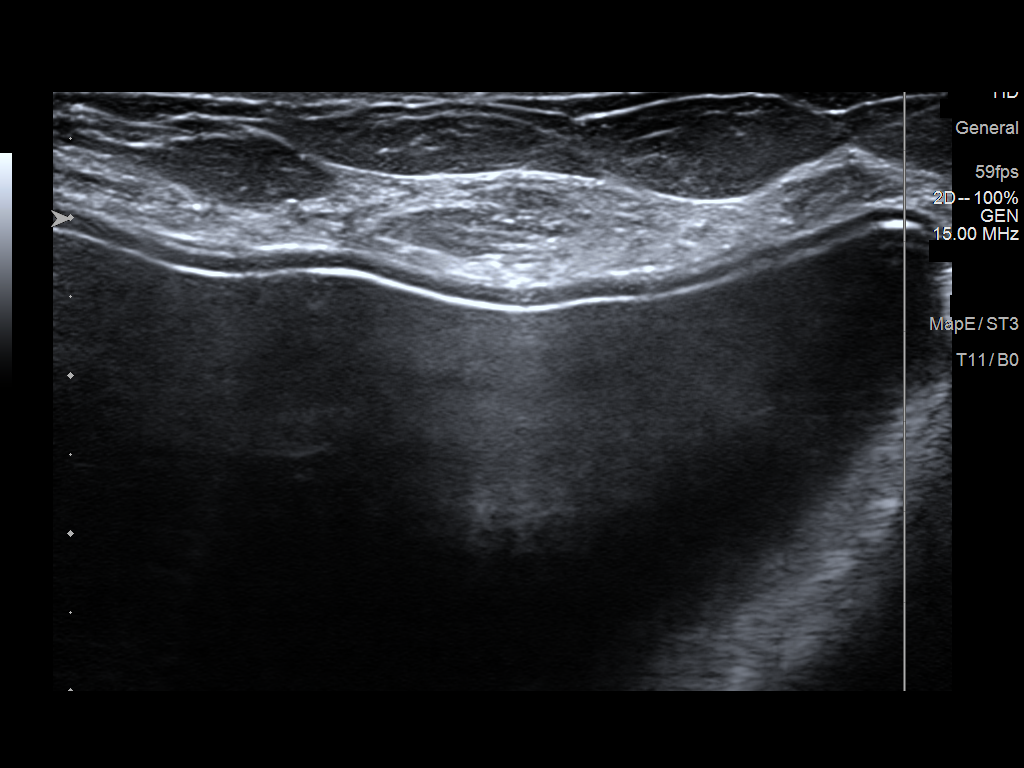
[im 2/5]
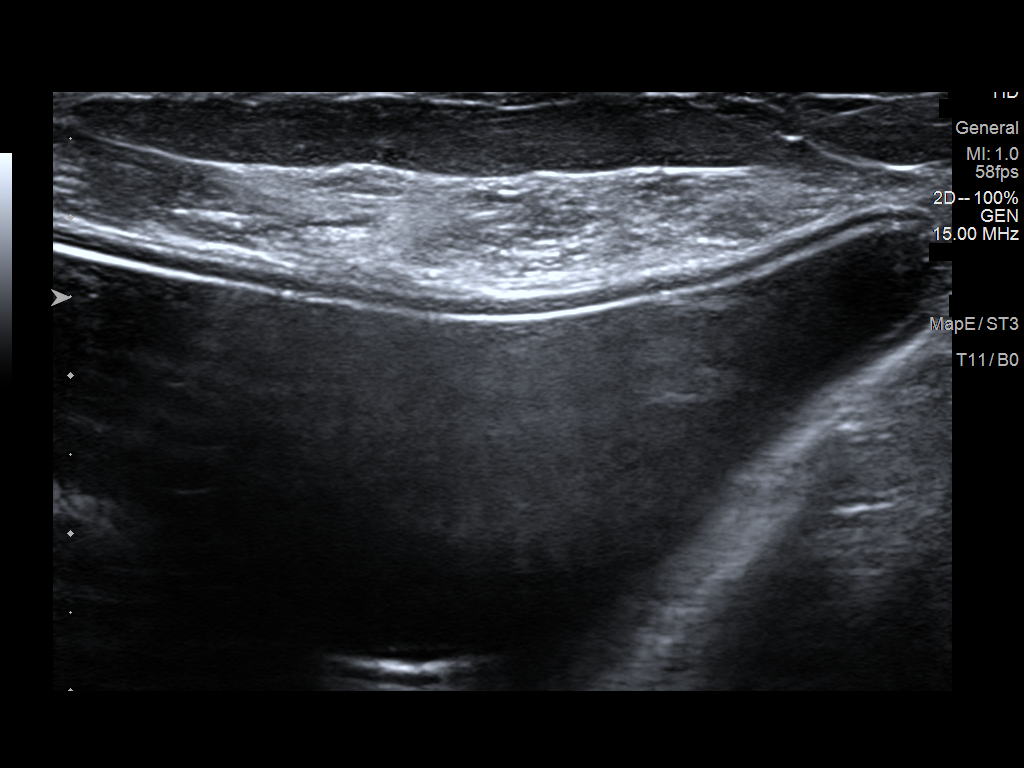
[im 3/5]
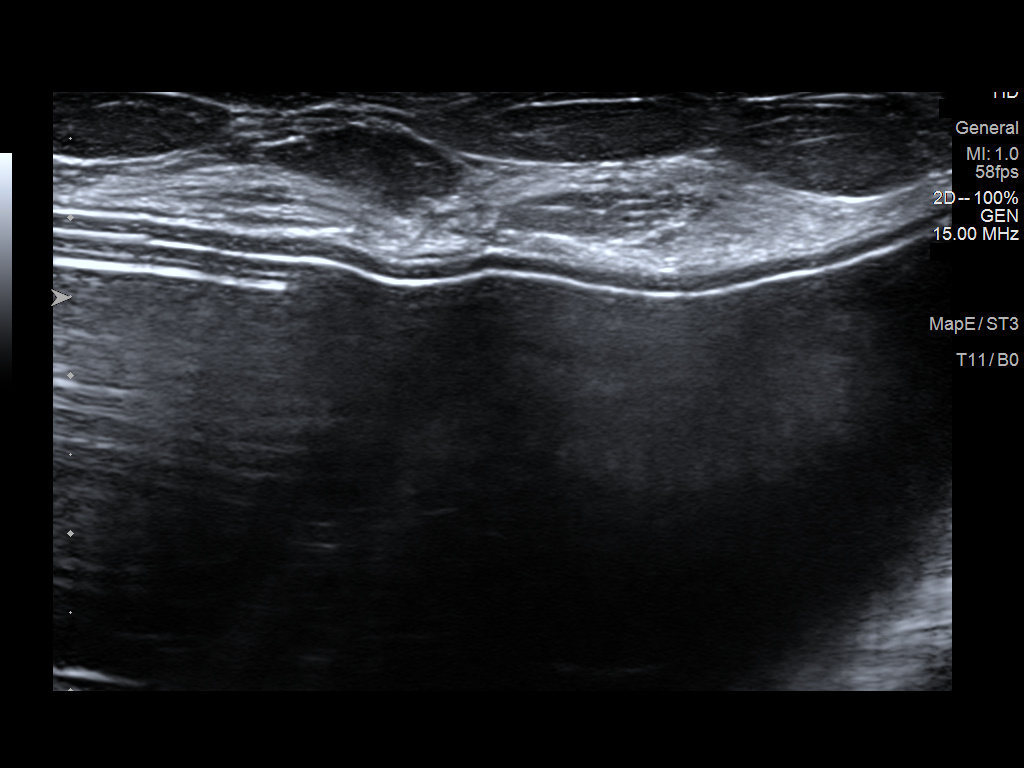
[im 4/5]
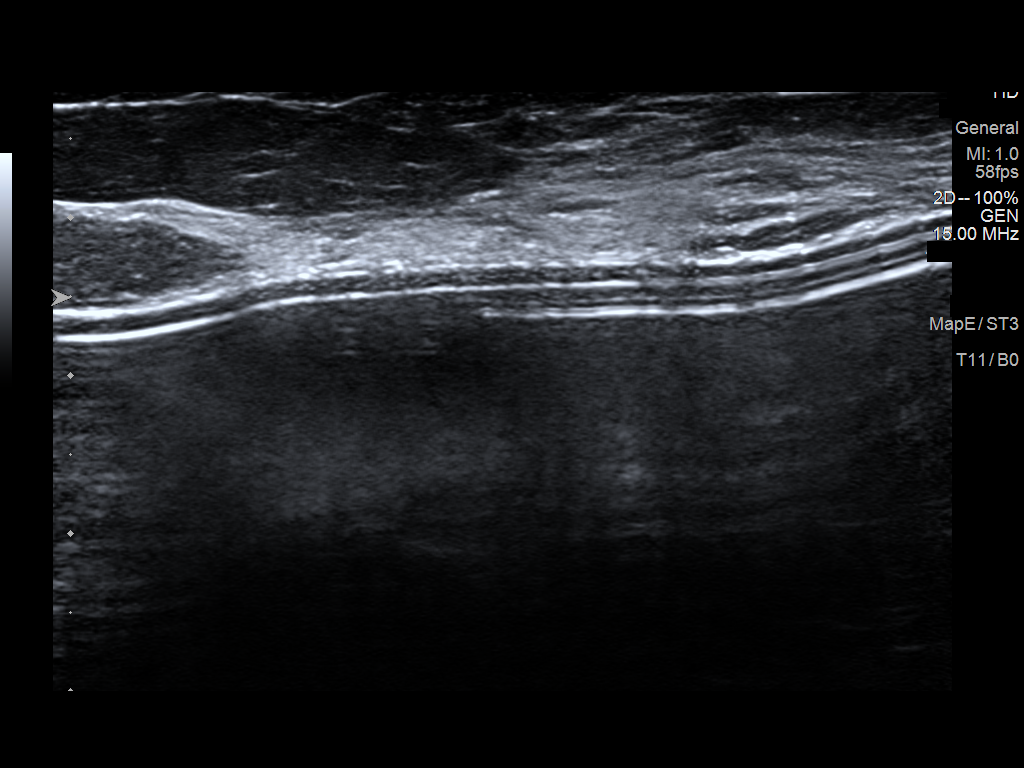
[im 5/5]
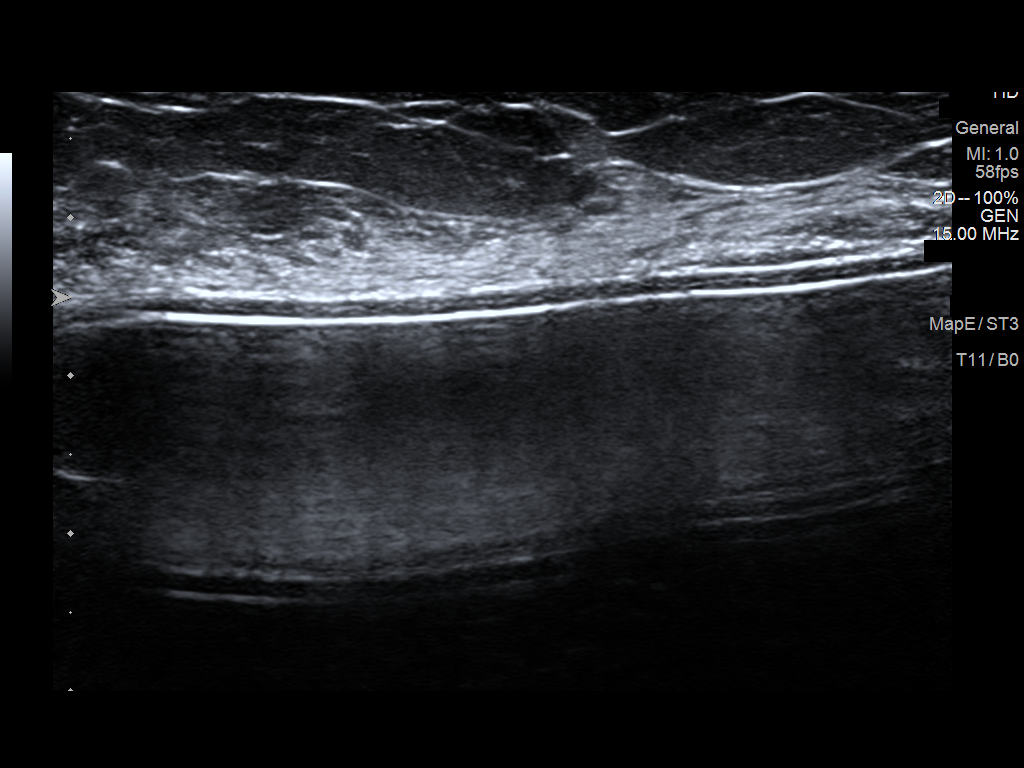

[5 of 5 positions shown; findings below may reference images not displayed]

ACR Breast Density Category c: The breast tissue is heterogeneously
dense, which may obscure small masses.
FINDINGS: The patient has bilateral retroglandular silicone implants. There is
no mammographic evidence of extracapsular silicone. No abnormal
contour of the implants is identified. No other suspicious
calcifications, masses or areas of distortion are seen in the
bilateral breasts.

Mammographic images were processed with CAD.

Physical exam of the right breast demonstrates an area of rippling
at the area identified by the patient as the implant folding. No
palpable masses are identified within the breast tissue.

Ultrasound of the superior to upper inner right breast demonstrates
normal fibroglandular tissue. No masses or suspicious areas of
shadowing are identified.
IMPRESSION: 1. There are no suspicious mammographic or targeted sonographic
abnormalities in the right breast in the area of rippling.

2.  No mammographic evidence of extracapsular silicone.

3.  No mammographic evidence of malignancy in the bilateral breasts.

RECOMMENDATION:
1. Consultation with plastic surgery is recommended for the concerns
regarding displacement of the patient right breast implant.

2. Screening mammogram at age 40 unless there are persistent or
intervening clinical concerns. (Code:[id number]-R-59X)

I have discussed the findings and recommendations with the patient.
If applicable, a reminder letter will be sent to the patient
regarding the next appointment.

BI-RADS CATEGORY  1: Negative.

## 2020-07-14 IMAGING — MG MM  DIGITAL DIAGNOSTIC BREAST BILAT IMPLANT W/ TOMO W/ CAD
8 of 14 series · 8 of 34 positions shown · non-contrast
Comparison: Previous exam(s).

CLINICAL DATA: 37-year-old female with history of silicone implants
initially placed in [place] in 8388. She complains of a fold in the
implant in the upper inner right side when raising her arm, which
has been occurring since 2201. She also states that the right breast
implant is moving to the right.

EXAM:
2D DIGITAL DIAGNOSTIC BILATERAL MAMMOGRAM WITH IMPLANTS, CAD AND
ADJUNCT TOMO
The patient has retroglandular implants. Standard and implant
displaced views were performed.
RIGHT BREAST ULTRASOUND

[L MLO]
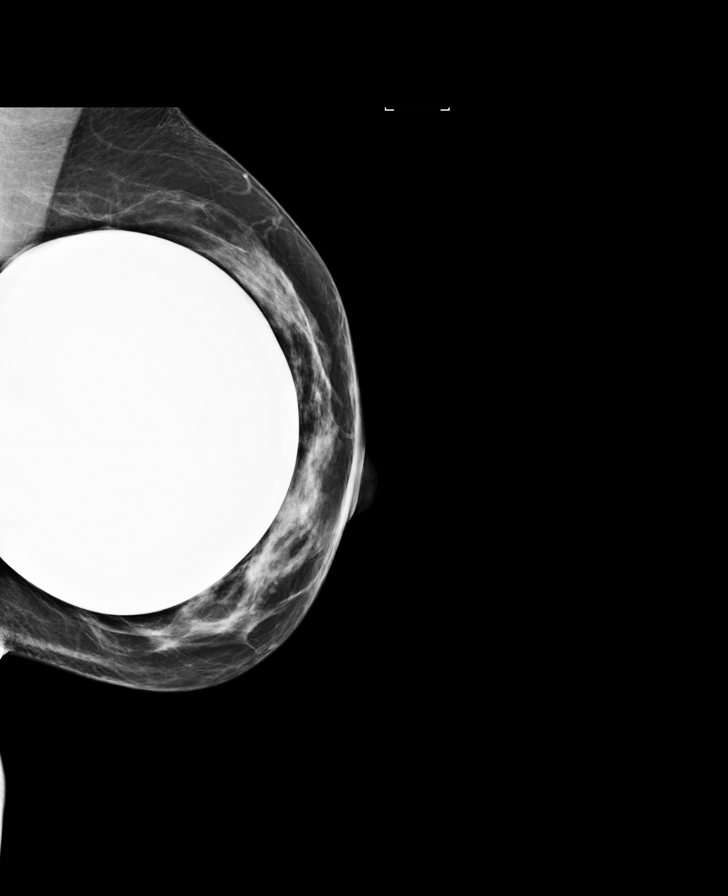

[R MLO]
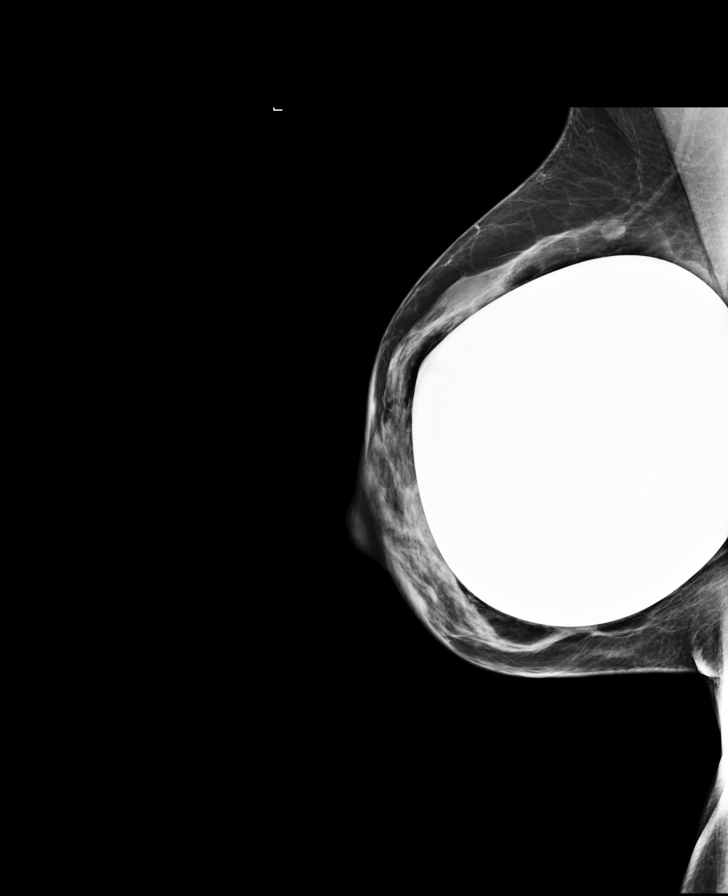

[R CC]
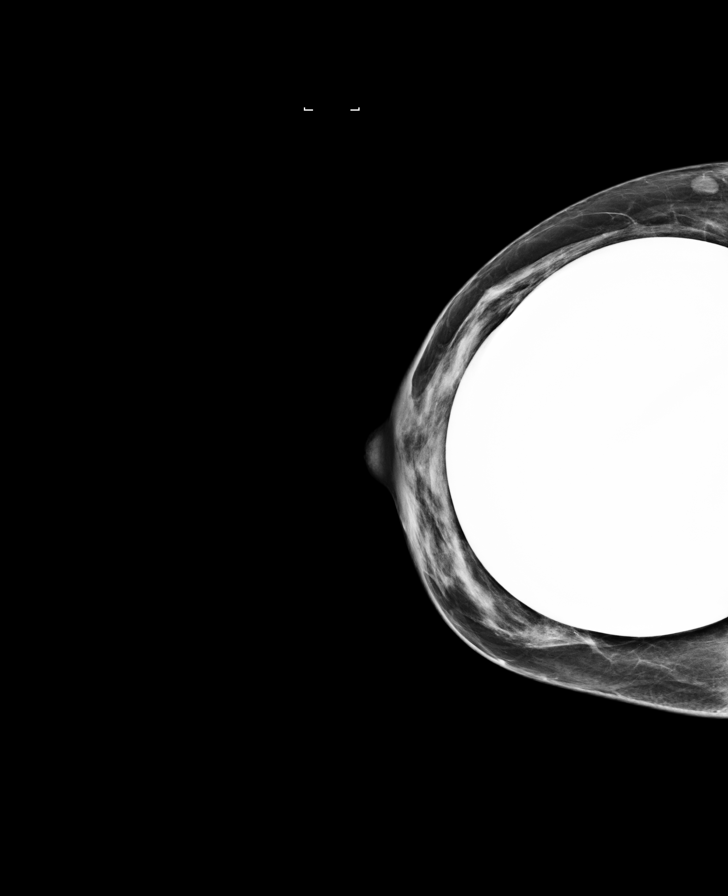

[L CC]
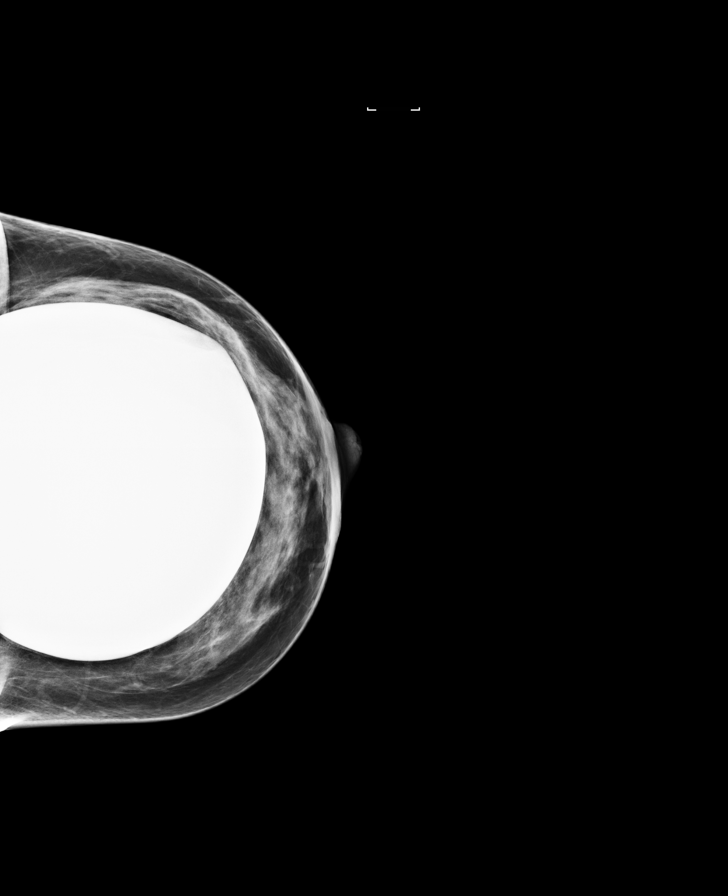

[L MLO synth-2D (1 of 2)]
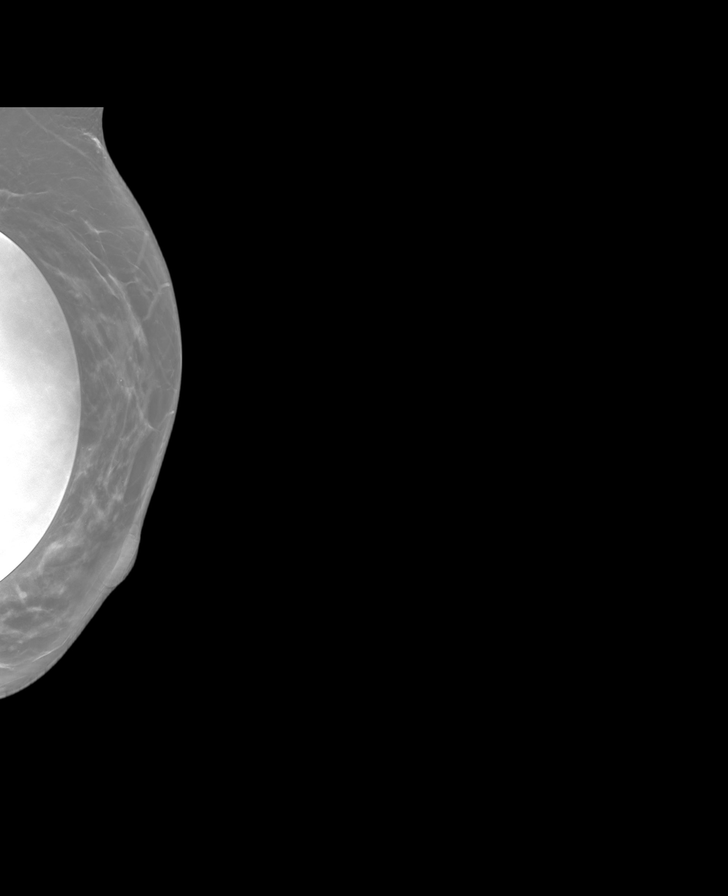

[L MLO synth-2D (2 of 2)]
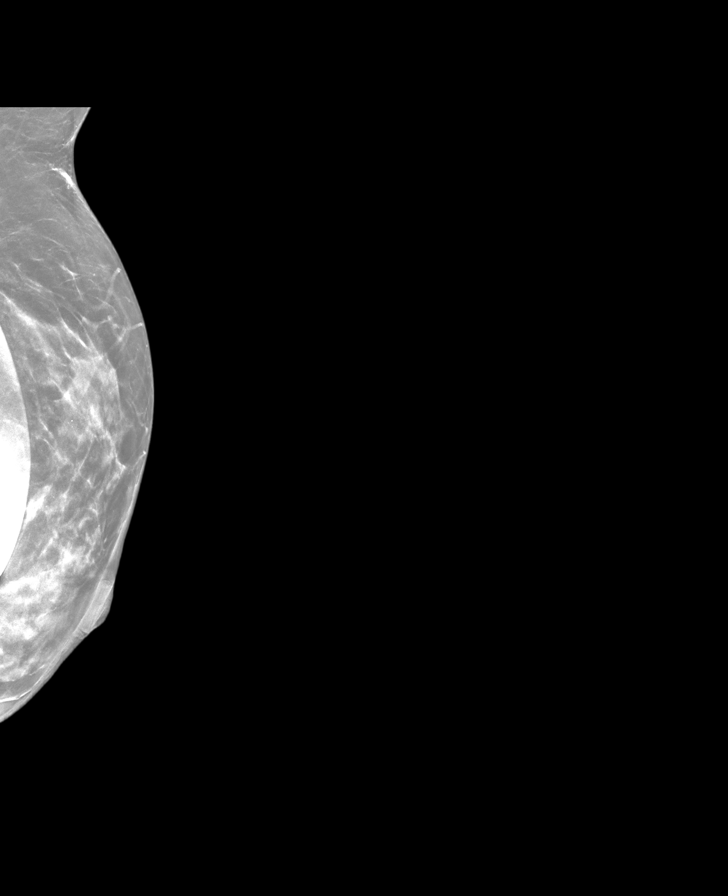

[L CC synth-2D]
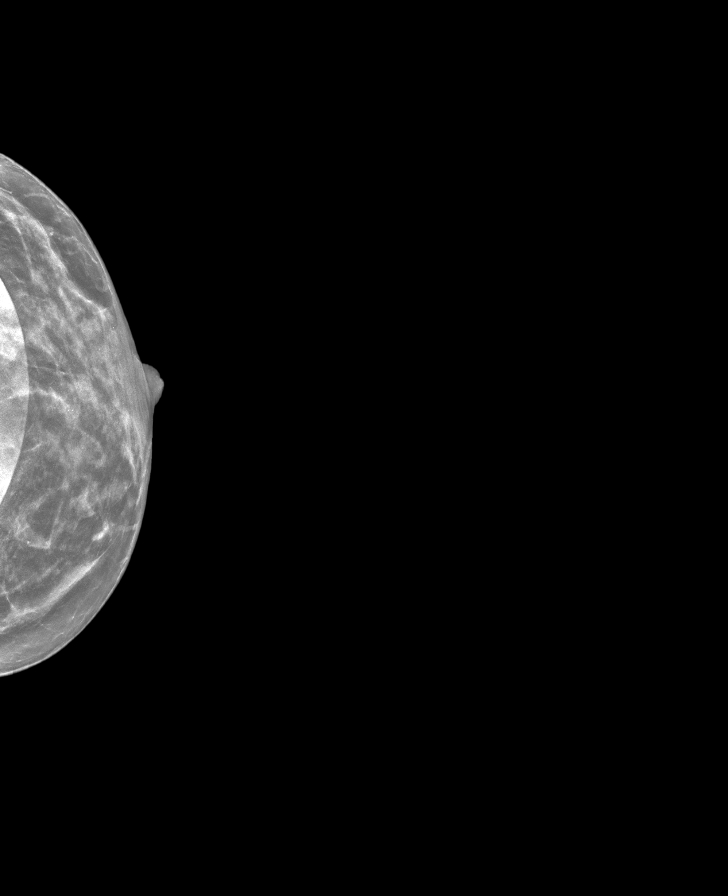

[R MLO synth-2D]
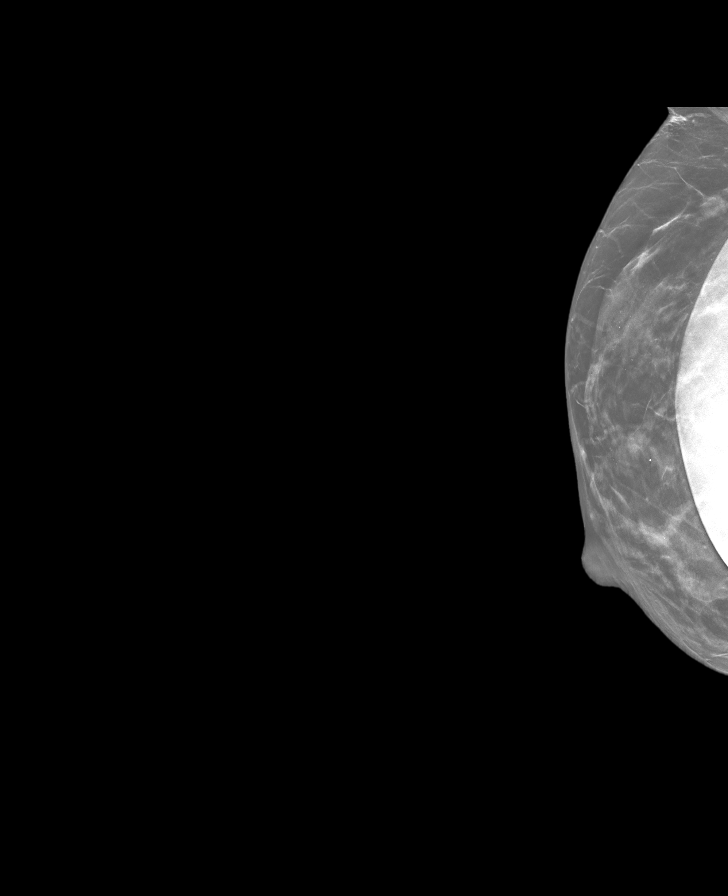

[8 of 34 positions shown; findings below may reference images not displayed]

ACR Breast Density Category c: The breast tissue is heterogeneously
dense, which may obscure small masses.
FINDINGS: The patient has bilateral retroglandular silicone implants. There is
no mammographic evidence of extracapsular silicone. No abnormal
contour of the implants is identified. No other suspicious
calcifications, masses or areas of distortion are seen in the
bilateral breasts.

Mammographic images were processed with CAD.

Physical exam of the right breast demonstrates an area of rippling
at the area identified by the patient as the implant folding. No
palpable masses are identified within the breast tissue.

Ultrasound of the superior to upper inner right breast demonstrates
normal fibroglandular tissue. No masses or suspicious areas of
shadowing are identified.
IMPRESSION: 1. There are no suspicious mammographic or targeted sonographic
abnormalities in the right breast in the area of rippling.

2.  No mammographic evidence of extracapsular silicone.

3.  No mammographic evidence of malignancy in the bilateral breasts.

RECOMMENDATION:
1. Consultation with plastic surgery is recommended for the concerns
regarding displacement of the patient right breast implant.

2. Screening mammogram at age 40 unless there are persistent or
intervening clinical concerns. (Code:[id number]-59X)

I have discussed the findings and recommendations with the patient.
If applicable, a reminder letter will be sent to the patient
regarding the next appointment.

BI-RADS CATEGORY  1: Negative.

## 2020-10-25 NOTE — Progress Notes (Signed)
40 y.o. G22P2012 Married [place] female here for annual exam.    Went to [facility] and received her test results this am.  Jannet Mantis to loose weight.  Eating healthy and doing exercise.   Cholesterol is elevated, LDL 155, and she has prediabetes.  A1C is 5.7.  Told her thyroid and testosterone was low.   She received a prescription for testosterone cream.  She did not start Wellbutrin XL last year for decreased libido.   She is having headaches still.   Did allergy testing which was normal.  Bladder is doing well.  She is doing exercises.   PCP:   Bernadette Hoit, MD  No LMP recorded. (Menstrual status: IUD).           Sexually active: Yes.    The current method of family planning is IUD.   Mirena IUD 05/25/19. Exercising: Yes.    Gym.  Smoker:  no  Health Maintenance: Pap:  10-14-18 normal neg HPV History of abnormal Pap:  no MMG:  01-07-19 normal Colonoscopy:  N/A BMD:   N/A Result  N/A TDaP:  2016 Gardasil:   yes HIV:Neg Hep C:Neg Screening Labs:  Hb today: PCP, Urine today: PCP   reports that she has never smoked. She has never used smokeless tobacco. She reports current alcohol use. She reports that she does not use drugs.  Past Medical History:  Diagnosis Date   Elevated hemoglobin A1c 2017   Level - 5.7   Gestational diabetes    Gestational diabetes    Low vitamin D level    Medical history non-contributory    Migraine headache with aura    Pregnancy induced hypertension    Status post vacuum-assisted vaginal delivery (1/15) 05/08/2014    Past Surgical History:  Procedure Laterality Date   AUGMENTATION MAMMAPLASTY  2012   Silicone in [place]   BREAST ENHANCEMENT SURGERY     CESAREAN SECTION  01-29-09   in [place]   COSMETIC SURGERY  2012   liposuction--[place]    Current Outpatient Medications  Medication Sig Dispense Refill   levocetirizine (XYZAL) 5 MG tablet Take 1 tablet (5 mg total) by mouth every evening. 30 tablet 5   levonorgestrel (MIRENA) 20  MCG/24HR IUD by Intrauterine route.     No current facility-administered medications for this visit.    Family History  Problem Relation Age of Onset   Stroke Mother    Hypertension Mother    Allergic rhinitis Mother    Migraines Mother    Cancer Father 58       Dec--metastatic CA lung   Hypertension Father    Cancer Maternal Grandfather        Dec--pancreatic CA   Asthma Neg Hx    Eczema Neg Hx    Urticaria Neg Hx    Immunodeficiency Neg Hx    Angioedema Neg Hx     Review of Systems  All other systems reviewed and are negative.  Exam:   BP 122/74 (BP Location: Left Arm, Patient Position: Sitting, Cuff Size: Normal)   Pulse 74   Ht 5\' 5"  (1.651 m)   Wt 166 lb (75.3 kg)   SpO2 98%   BMI 27.62 kg/m     General appearance: alert, cooperative and appears stated age Head: normocephalic, without obvious abnormality, atraumatic Neck: no adenopathy, supple, symmetrical, trachea midline and thyroid normal to inspection and palpation Lungs: clear to auscultation bilaterally Breasts: consistent with bilateral augmentation.  No masses or tenderness, No nipple retraction or dimpling, No  nipple discharge or bleeding, No axillary adenopathy Heart: regular rate and rhythm Abdomen: soft, non-tender; no masses, no organomegaly Extremities: extremities normal, atraumatic, no cyanosis or edema Skin: skin color, texture, turgor normal. No rashes or lesions Lymph nodes: cervical, supraclavicular, and axillary nodes normal. Neurologic: grossly normal  Pelvic: External genitalia:  no lesions              No abnormal inguinal nodes palpated.              Urethra:  normal appearing urethra with no masses, tenderness or lesions              Bartholins and Skenes: normal                 Vagina: normal appearing vagina with normal color and discharge, no lesions              Cervix: no lesions.  IUD string noted.               Pap taken: no. Bimanual Exam:  Uterus:  normal size, contour,  position, consistency, mobility, non-tender              Adnexa: no mass, fullness, tenderness           Chaperone was present for exam.  Assessment:   Well woman visit with normal exam. Mirena IUD. Bilateral breast augmentation. Migraine with aura. Decreased libido. Increased LDL cholesterol.  Elevated A1C.  Plan: Mammogram screening discussed.  She will do at work this fall with [employer].  Self breast awareness reviewed. Pap and HR HPV 2025.  Guidelines for Calcium, Vitamin D, regular exercise program including cardiovascular and weight bearing exercise. She is planning to start testosterone therapy through [facility]. Follow up annually and prn.   After visit summary provided.

## 2020-10-31 ENCOUNTER — Other Ambulatory Visit: Payer: Self-pay

## 2020-10-31 ENCOUNTER — Ambulatory Visit (INDEPENDENT_AMBULATORY_CARE_PROVIDER_SITE_OTHER): Payer: BC Managed Care – PPO | Admitting: Obstetrics and Gynecology

## 2020-10-31 ENCOUNTER — Encounter: Payer: Self-pay | Admitting: Obstetrics and Gynecology

## 2020-10-31 VITALS — BP 122/74 | HR 74 | Ht 65.0 in | Wt 166.0 lb

## 2020-10-31 DIAGNOSIS — Z01419 Encounter for gynecological examination (general) (routine) without abnormal findings: Secondary | ICD-10-CM | POA: Diagnosis not present

## 2020-10-31 NOTE — Patient Instructions (Signed)

## 2021-03-14 ENCOUNTER — Encounter: Payer: Self-pay | Admitting: Obstetrics and Gynecology

## 2021-11-01 ENCOUNTER — Ambulatory Visit: Payer: BC Managed Care – PPO | Admitting: Obstetrics and Gynecology

## 2021-12-11 DIAGNOSIS — M9902 Segmental and somatic dysfunction of thoracic region: Secondary | ICD-10-CM | POA: Diagnosis not present

## 2021-12-11 DIAGNOSIS — M9903 Segmental and somatic dysfunction of lumbar region: Secondary | ICD-10-CM | POA: Diagnosis not present

## 2021-12-11 DIAGNOSIS — M9901 Segmental and somatic dysfunction of cervical region: Secondary | ICD-10-CM | POA: Diagnosis not present

## 2021-12-11 DIAGNOSIS — M9905 Segmental and somatic dysfunction of pelvic region: Secondary | ICD-10-CM | POA: Diagnosis not present

## 2021-12-27 ENCOUNTER — Ambulatory Visit (INDEPENDENT_AMBULATORY_CARE_PROVIDER_SITE_OTHER): Payer: BC Managed Care – PPO | Admitting: Obstetrics and Gynecology

## 2021-12-27 ENCOUNTER — Encounter: Payer: Self-pay | Admitting: Obstetrics and Gynecology

## 2021-12-27 VITALS — BP 100/60 | HR 110 | Ht 65.0 in | Wt 141.0 lb

## 2021-12-27 DIAGNOSIS — Z01419 Encounter for gynecological examination (general) (routine) without abnormal findings: Secondary | ICD-10-CM

## 2021-12-27 NOTE — Patient Instructions (Signed)
EXERCISE AND DIET:  We recommended that you start or continue a regular exercise program for good health. Regular exercise means any activity that makes your heart beat faster and makes you sweat.  We recommend exercising at least 30 minutes per day at least 3 days a week, preferably 4 or 5.  We also recommend a diet low in fat and sugar.  Inactivity, poor dietary choices and obesity can cause diabetes, heart attack, stroke, and kidney damage, among others.    ALCOHOL AND SMOKING:  Women should limit their alcohol intake to no more than 7 drinks/beers/glasses of wine (combined, not each!) per week. Moderation of alcohol intake to this level decreases your risk of breast cancer and liver damage. And of course, no recreational drugs are part of a healthy lifestyle.  And absolutely no smoking or even second hand smoke. Most people know smoking can cause heart and lung diseases, but did you know it also contributes to weakening of your bones? Aging of your skin?  Yellowing of your teeth and nails?  CALCIUM AND VITAMIN D:  Adequate intake of calcium and Vitamin D are recommended.  The recommendations for exact amounts of these supplements seem to change often, but generally speaking 600 mg of calcium (either carbonate or citrate) and 800 units of Vitamin D per day seems prudent. Certain women may benefit from higher intake of Vitamin D.  If you are among these women, your doctor will have told you during your visit.    PAP SMEARS:  Pap smears, to check for cervical cancer or precancers,  have traditionally been done yearly, although recent scientific advances have shown that most women can have pap smears less often.  However, every woman still should have a physical exam from her gynecologist every year. It will include a breast check, inspection of the vulva and vagina to check for abnormal growths or skin changes, a visual exam of the cervix, and then an exam to evaluate the size and shape of the uterus and  ovaries.  And after 40 years of age, a rectal exam is indicated to check for rectal cancers. We will also provide age appropriate advice regarding health maintenance, like when you should have certain vaccines, screening for sexually transmitted diseases, bone density testing, colonoscopy, mammograms, etc.   MAMMOGRAMS:  All women over 40 years old should have a yearly mammogram. Many facilities now offer a "3D" mammogram, which may cost around $50 extra out of pocket. If possible,  we recommend you accept the option to have the 3D mammogram performed.  It both reduces the number of women who will be called back for extra views which then turn out to be normal, and it is better than the routine mammogram at detecting truly abnormal areas.    COLONOSCOPY:  Colonoscopy to screen for colon cancer is recommended for all women at age 50.  We know, you hate the idea of the prep.  We agree, BUT, having colon cancer and not knowing it is worse!!  Colon cancer so often starts as a polyp that can be seen and removed at colonscopy, which can quite literally save your life!  And if your first colonoscopy is normal and you have no family history of colon cancer, most women don't have to have it again for 10 years.  Once every ten years, you can do something that may end up saving your life, right?  We will be happy to help you get it scheduled when you are ready.    Be sure to check your insurance coverage so you understand how much it will cost.  It may be covered as a preventative service at no cost, but you should check your particular policy.    Calcium Content in Foods Calcium is the most abundant mineral in the body. Most of the body's calcium supply is stored in bones and teeth. Calcium helps many parts of the body function normally, including: Blood and blood vessels. Nerves. Hormones. Muscles. Bones and teeth. When your calcium stores are low, you may be at risk for low bone mass, bone loss, and broken bones  (fractures). When you get enough calcium, it helps to support strong bones and teeth throughout your life. Calcium is especially important for: Children during growth spurts. Girls during adolescence. Women who are pregnant or breastfeeding. Women after their menstrual cycle stops (postmenopause). Women whose menstrual cycle has stopped due to anorexia nervosa or regular intense exercise. People who cannot eat or digest dairy products. Vegans. Recommended daily amounts of calcium: Women (ages 19 to 50): 1,000 mg per day. Women (ages 51 and older): 1,200 mg per day. Men (ages 19 to 70): 1,000 mg per day. Men (ages 71 and older): 1,200 mg per day. Women (ages 9 to 18): 1,300 mg per day. Men (ages 9 to 18): 1,300 mg per day. General information Eat foods that are high in calcium. Try to get most of your calcium from food. Some people may benefit from taking calcium supplements. Check with your health care provider or diet and nutrition specialist (dietitian) before starting any calcium supplements. Calcium supplements may interact with certain medicines. Too much calcium may cause other health problems, such as constipation and kidney stones. For the body to absorb calcium, it needs vitamin D. Sources of vitamin D include: Skin exposure to direct sunlight. Foods, such as egg yolks, liver, mushrooms, saltwater fish, and fortified milk. Vitamin D supplements. Check with your health care provider or dietitian before starting any vitamin D supplements. What foods are high in calcium?  Foods that are high in calcium contain more than 100 milligrams per serving. Fruits Fortified orange juice or other fruit juice, 300 mg per 8 oz serving. Vegetables Collard greens, 360 mg per 8 oz serving. Kale, 100 mg per 8 oz serving. Bok choy, 160 mg per 8 oz serving. Grains Fortified ready-to-eat cereals, 100 to 1,000 mg per 8 oz serving. Fortified frozen waffles, 200 mg in 2 waffles. Oatmeal, 140 mg in  1 cup. Meats and other proteins Sardines, canned with bones, 325 mg per 3 oz serving. Salmon, canned with bones, 180 mg per 3 oz serving. Canned shrimp, 125 mg per 3 oz serving. Baked beans, 160 mg per 4 oz serving. Tofu, firm, made with calcium sulfate, 253 mg per 4 oz serving. Dairy Yogurt, plain, low-fat, 310 mg per 6 oz serving. Nonfat milk, 300 mg per 8 oz serving. American cheese, 195 mg per 1 oz serving. Cheddar cheese, 205 mg per 1 oz serving. Cottage cheese 2%, 105 mg per 4 oz serving. Fortified soy, rice, or almond milk, 300 mg per 8 oz serving. Mozzarella, part skim, 210 mg per 1 oz serving. The items listed above may not be a complete list of foods high in calcium. Actual amounts of calcium may be different depending on processing. Contact a dietitian for more information. What foods are lower in calcium? Foods that are lower in calcium contain 50 mg or less per serving. Fruits Apple, about 6 mg. Banana, about 12 mg.   Vegetables Lettuce, 19 mg per 2 oz serving. Tomato, about 11 mg. Grains Rice, 4 mg per 6 oz serving. Boiled potatoes, 14 mg per 8 oz serving. White bread, 6 mg per slice. Meats and other proteins Egg, 27 mg per 2 oz serving. Red meat, 7 mg per 4 oz serving. Chicken, 17 mg per 4 oz serving. Fish, cod, or trout, 20 mg per 4 oz serving. Dairy Cream cheese, regular, 14 mg per 1 Tbsp serving. Brie cheese, 50 mg per 1 oz serving. Parmesan cheese, 70 mg per 1 Tbsp serving. The items listed above may not be a complete list of foods lower in calcium. Actual amounts of calcium may be different depending on processing. Contact a dietitian for more information. Summary Calcium is an important mineral in the body because it affects many functions. Getting enough calcium helps support strong bones and teeth throughout your life. Try to get most of your calcium from food. Calcium supplements may interact with certain medicines. Check with your health care provider  or dietitian before starting any calcium supplements. This information is not intended to replace advice given to you by your health care provider. Make sure you discuss any questions you have with your health care provider. Document Revised: 08/05/2019 Document Reviewed: 08/05/2019 Elsevier Patient Education  2023 Elsevier Inc.  

## 2021-12-27 NOTE — Progress Notes (Signed)
41 y.o. G44P2012 Married [place] female here for annual exam.    No menses with Mirena.   [facility] prescribing testosterone cream and thyroid replacement.   Vit D 5000 and MVI daily.   PCP:  Bernadette Hoit, MD  No LMP recorded. (Menstrual status: IUD).     Period Cycle (Days):  (no cycle with Mirena IUD)     Sexually active: Yes.    The current method of family planning is IUD--Mirena--05/25/19.    Exercising: Yes.     Walking and works out at gym Smoker:  no  Health Maintenance: Pap:  10-14-18 Neg:Neg HR HPV, 07-30-17 Neg:Neg HR HPV History of abnormal Pap:  no MMG: 03-08-21 Implants/Neg/Birads2 Colonoscopy:  n/a BMD:   n/a  Result  n/a TDaP:  2016 Gardasil:   YES HIV: Neg in past Hep C: Neg in past Screening Labs:  PCP.   reports that she has never smoked. She has never used smokeless tobacco. She reports current alcohol use of about 2.0 standard drinks of alcohol per week. She reports that she does not use drugs.  Past Medical History:  Diagnosis Date   Elevated hemoglobin A1c 2017   Level - 5.7   Gestational diabetes    Gestational diabetes    Low vitamin D level    Medical history non-contributory    Migraine headache with aura    Pregnancy induced hypertension    Status post vacuum-assisted vaginal delivery (1/15) 05/08/2014    Past Surgical History:  Procedure Laterality Date   AUGMENTATION MAMMAPLASTY  2012   [facility] in [place]   BREAST ENHANCEMENT SURGERY     CESAREAN SECTION  01-29-09   in [place]   COSMETIC SURGERY  2012   liposuction--[place]    Current Outpatient Medications  Medication Sig Dispense Refill   eszopiclone (LUNESTA) 1 MG TABS tablet Take 1 mg by mouth at bedtime.     levocetirizine (XYZAL) 5 MG tablet Take 1 tablet (5 mg total) by mouth every evening. 30 tablet 5   levonorgestrel (MIRENA) 20 MCG/24HR IUD by Intrauterine route.     NP THYROID 30 MG tablet Take 30 mg by mouth daily.     PRESCRIPTION MEDICATION Testosterone cream 2% 2  clicks behind the knee     No current facility-administered medications for this visit.    Family History  Problem Relation Age of Onset   Stroke Mother    Hypertension Mother    Allergic rhinitis Mother    Migraines Mother    Cancer Father 9       Dec--metastatic CA lung   Hypertension Father    Cancer Maternal Grandfather        Dec--pancreatic CA   Asthma Neg Hx    Eczema Neg Hx    Urticaria Neg Hx    Immunodeficiency Neg Hx    Angioedema Neg Hx     Review of Systems  All other systems reviewed and are negative.   Exam:   BP 100/60   Pulse (!) 110   Ht 5\' 5"  (1.651 m)   Wt 141 lb (64 kg)   SpO2 96%   BMI 23.46 kg/m     General appearance: alert, cooperative and appears stated age Head: normocephalic, without obvious abnormality, atraumatic Neck: no adenopathy, supple, symmetrical, trachea midline and thyroid normal to inspection and palpation Lungs: clear to auscultation bilaterally Breasts: bilateral augmentation, no masses or tenderness, No nipple retraction or dimpling, No nipple discharge or bleeding, No axillary adenopathy Heart: regular rate and  rhythm Abdomen: soft, non-tender; no masses, no organomegaly Extremities: extremities normal, atraumatic, no cyanosis or edema Skin: skin color, texture, turgor normal. No rashes or lesions Lymph nodes: cervical, supraclavicular, and axillary nodes normal. Neurologic: grossly normal  Pelvic: External genitalia:  no lesions              No abnormal inguinal nodes palpated.              Urethra:  normal appearing urethra with no masses, tenderness or lesions              Bartholins and Skenes: normal                 Vagina: normal appearing vagina with normal color and discharge, no lesions              Cervix: no lesions.  IUD strings noted.               Pap taken: no Bimanual Exam:  Uterus:  normal size, contour, position, consistency, mobility, non-tender              Adnexa: no mass, fullness, tenderness               Rectal exam: yes.  Confirms.              Anus:  normal sphincter tone, no lesions  Chaperone was present for exam:  Marchelle Folks, CMA  Assessment:   Well woman visit with gynecologic exam. Mirena IUD. Bilateral breast augmentation. Migraine with aura. Decreased libido.  On Testosterone therapy.  Increased LDL cholesterol.  Elevated A1C.  Plan: Mammogram screening discussed. Self breast awareness reviewed. Pap and HR HPV as above. Guidelines for Calcium, Vitamin D, regular exercise program including cardiovascular and weight bearing exercise. We reviewed 8 year use of Mirena IUD.   Follow up annually and prn.   After visit summary provided.

## 2022-01-08 DIAGNOSIS — M9905 Segmental and somatic dysfunction of pelvic region: Secondary | ICD-10-CM | POA: Diagnosis not present

## 2022-01-08 DIAGNOSIS — M9901 Segmental and somatic dysfunction of cervical region: Secondary | ICD-10-CM | POA: Diagnosis not present

## 2022-01-08 DIAGNOSIS — M9903 Segmental and somatic dysfunction of lumbar region: Secondary | ICD-10-CM | POA: Diagnosis not present

## 2022-01-08 DIAGNOSIS — M9902 Segmental and somatic dysfunction of thoracic region: Secondary | ICD-10-CM | POA: Diagnosis not present

## 2022-02-19 DIAGNOSIS — M9901 Segmental and somatic dysfunction of cervical region: Secondary | ICD-10-CM | POA: Diagnosis not present

## 2022-02-19 DIAGNOSIS — M9902 Segmental and somatic dysfunction of thoracic region: Secondary | ICD-10-CM | POA: Diagnosis not present

## 2022-02-19 DIAGNOSIS — M9903 Segmental and somatic dysfunction of lumbar region: Secondary | ICD-10-CM | POA: Diagnosis not present

## 2022-02-19 DIAGNOSIS — M9905 Segmental and somatic dysfunction of pelvic region: Secondary | ICD-10-CM | POA: Diagnosis not present

## 2022-03-01 DIAGNOSIS — Z1231 Encounter for screening mammogram for malignant neoplasm of breast: Secondary | ICD-10-CM | POA: Diagnosis not present

## 2022-04-03 DIAGNOSIS — Z131 Encounter for screening for diabetes mellitus: Secondary | ICD-10-CM | POA: Diagnosis not present

## 2022-04-03 DIAGNOSIS — R790 Abnormal level of blood mineral: Secondary | ICD-10-CM | POA: Diagnosis not present

## 2022-04-03 DIAGNOSIS — E538 Deficiency of other specified B group vitamins: Secondary | ICD-10-CM | POA: Diagnosis not present

## 2022-04-03 DIAGNOSIS — E559 Vitamin D deficiency, unspecified: Secondary | ICD-10-CM | POA: Diagnosis not present

## 2022-04-03 DIAGNOSIS — F419 Anxiety disorder, unspecified: Secondary | ICD-10-CM | POA: Diagnosis not present

## 2022-04-03 DIAGNOSIS — E039 Hypothyroidism, unspecified: Secondary | ICD-10-CM | POA: Diagnosis not present

## 2022-04-03 DIAGNOSIS — Z8632 Personal history of gestational diabetes: Secondary | ICD-10-CM | POA: Diagnosis not present

## 2022-04-03 DIAGNOSIS — R5383 Other fatigue: Secondary | ICD-10-CM | POA: Diagnosis not present

## 2022-04-09 DIAGNOSIS — R519 Headache, unspecified: Secondary | ICD-10-CM | POA: Diagnosis not present

## 2022-04-09 DIAGNOSIS — F419 Anxiety disorder, unspecified: Secondary | ICD-10-CM | POA: Diagnosis not present

## 2022-04-09 DIAGNOSIS — E039 Hypothyroidism, unspecified: Secondary | ICD-10-CM | POA: Diagnosis not present

## 2022-04-09 DIAGNOSIS — R5383 Other fatigue: Secondary | ICD-10-CM | POA: Diagnosis not present

## 2022-06-27 DIAGNOSIS — M9901 Segmental and somatic dysfunction of cervical region: Secondary | ICD-10-CM | POA: Diagnosis not present

## 2022-06-27 DIAGNOSIS — M9904 Segmental and somatic dysfunction of sacral region: Secondary | ICD-10-CM | POA: Diagnosis not present

## 2022-06-27 DIAGNOSIS — M9903 Segmental and somatic dysfunction of lumbar region: Secondary | ICD-10-CM | POA: Diagnosis not present

## 2022-06-27 DIAGNOSIS — M9902 Segmental and somatic dysfunction of thoracic region: Secondary | ICD-10-CM | POA: Diagnosis not present

## 2022-06-27 DIAGNOSIS — M5386 Other specified dorsopathies, lumbar region: Secondary | ICD-10-CM | POA: Diagnosis not present

## 2022-07-04 DIAGNOSIS — M9901 Segmental and somatic dysfunction of cervical region: Secondary | ICD-10-CM | POA: Diagnosis not present

## 2022-07-04 DIAGNOSIS — M5386 Other specified dorsopathies, lumbar region: Secondary | ICD-10-CM | POA: Diagnosis not present

## 2022-07-04 DIAGNOSIS — M9904 Segmental and somatic dysfunction of sacral region: Secondary | ICD-10-CM | POA: Diagnosis not present

## 2022-07-04 DIAGNOSIS — M9903 Segmental and somatic dysfunction of lumbar region: Secondary | ICD-10-CM | POA: Diagnosis not present

## 2022-07-11 DIAGNOSIS — M5386 Other specified dorsopathies, lumbar region: Secondary | ICD-10-CM | POA: Diagnosis not present

## 2022-07-11 DIAGNOSIS — M9904 Segmental and somatic dysfunction of sacral region: Secondary | ICD-10-CM | POA: Diagnosis not present

## 2022-07-11 DIAGNOSIS — M9903 Segmental and somatic dysfunction of lumbar region: Secondary | ICD-10-CM | POA: Diagnosis not present

## 2022-07-11 DIAGNOSIS — M9901 Segmental and somatic dysfunction of cervical region: Secondary | ICD-10-CM | POA: Diagnosis not present

## 2022-07-25 DIAGNOSIS — M9903 Segmental and somatic dysfunction of lumbar region: Secondary | ICD-10-CM | POA: Diagnosis not present

## 2022-07-25 DIAGNOSIS — M9901 Segmental and somatic dysfunction of cervical region: Secondary | ICD-10-CM | POA: Diagnosis not present

## 2022-07-25 DIAGNOSIS — M9904 Segmental and somatic dysfunction of sacral region: Secondary | ICD-10-CM | POA: Diagnosis not present

## 2022-07-25 DIAGNOSIS — M5386 Other specified dorsopathies, lumbar region: Secondary | ICD-10-CM | POA: Diagnosis not present

## 2022-08-01 DIAGNOSIS — M5386 Other specified dorsopathies, lumbar region: Secondary | ICD-10-CM | POA: Diagnosis not present

## 2022-08-01 DIAGNOSIS — M9901 Segmental and somatic dysfunction of cervical region: Secondary | ICD-10-CM | POA: Diagnosis not present

## 2022-08-01 DIAGNOSIS — M9904 Segmental and somatic dysfunction of sacral region: Secondary | ICD-10-CM | POA: Diagnosis not present

## 2022-08-01 DIAGNOSIS — M9903 Segmental and somatic dysfunction of lumbar region: Secondary | ICD-10-CM | POA: Diagnosis not present

## 2022-08-03 DIAGNOSIS — E538 Deficiency of other specified B group vitamins: Secondary | ICD-10-CM | POA: Diagnosis not present

## 2022-08-03 DIAGNOSIS — R5383 Other fatigue: Secondary | ICD-10-CM | POA: Diagnosis not present

## 2022-08-03 DIAGNOSIS — R519 Headache, unspecified: Secondary | ICD-10-CM | POA: Diagnosis not present

## 2022-08-03 DIAGNOSIS — E039 Hypothyroidism, unspecified: Secondary | ICD-10-CM | POA: Diagnosis not present

## 2022-08-03 DIAGNOSIS — E559 Vitamin D deficiency, unspecified: Secondary | ICD-10-CM | POA: Diagnosis not present

## 2022-08-03 DIAGNOSIS — F419 Anxiety disorder, unspecified: Secondary | ICD-10-CM | POA: Diagnosis not present

## 2022-08-07 DIAGNOSIS — M5386 Other specified dorsopathies, lumbar region: Secondary | ICD-10-CM | POA: Diagnosis not present

## 2022-08-07 DIAGNOSIS — M9901 Segmental and somatic dysfunction of cervical region: Secondary | ICD-10-CM | POA: Diagnosis not present

## 2022-08-07 DIAGNOSIS — M9903 Segmental and somatic dysfunction of lumbar region: Secondary | ICD-10-CM | POA: Diagnosis not present

## 2022-08-07 DIAGNOSIS — M9904 Segmental and somatic dysfunction of sacral region: Secondary | ICD-10-CM | POA: Diagnosis not present

## 2022-08-13 DIAGNOSIS — F419 Anxiety disorder, unspecified: Secondary | ICD-10-CM | POA: Diagnosis not present

## 2022-08-13 DIAGNOSIS — R5383 Other fatigue: Secondary | ICD-10-CM | POA: Diagnosis not present

## 2022-08-13 DIAGNOSIS — R519 Headache, unspecified: Secondary | ICD-10-CM | POA: Diagnosis not present

## 2022-08-13 DIAGNOSIS — E039 Hypothyroidism, unspecified: Secondary | ICD-10-CM | POA: Diagnosis not present

## 2022-08-31 DIAGNOSIS — M9903 Segmental and somatic dysfunction of lumbar region: Secondary | ICD-10-CM | POA: Diagnosis not present

## 2022-08-31 DIAGNOSIS — M9904 Segmental and somatic dysfunction of sacral region: Secondary | ICD-10-CM | POA: Diagnosis not present

## 2022-08-31 DIAGNOSIS — M5386 Other specified dorsopathies, lumbar region: Secondary | ICD-10-CM | POA: Diagnosis not present

## 2022-08-31 DIAGNOSIS — M9901 Segmental and somatic dysfunction of cervical region: Secondary | ICD-10-CM | POA: Diagnosis not present

## 2022-09-20 DIAGNOSIS — M9903 Segmental and somatic dysfunction of lumbar region: Secondary | ICD-10-CM | POA: Diagnosis not present

## 2022-09-20 DIAGNOSIS — M5386 Other specified dorsopathies, lumbar region: Secondary | ICD-10-CM | POA: Diagnosis not present

## 2022-09-20 DIAGNOSIS — M9901 Segmental and somatic dysfunction of cervical region: Secondary | ICD-10-CM | POA: Diagnosis not present

## 2022-09-20 DIAGNOSIS — M9904 Segmental and somatic dysfunction of sacral region: Secondary | ICD-10-CM | POA: Diagnosis not present

## 2022-11-02 DIAGNOSIS — M5386 Other specified dorsopathies, lumbar region: Secondary | ICD-10-CM | POA: Diagnosis not present

## 2022-11-02 DIAGNOSIS — M9901 Segmental and somatic dysfunction of cervical region: Secondary | ICD-10-CM | POA: Diagnosis not present

## 2022-11-02 DIAGNOSIS — M9904 Segmental and somatic dysfunction of sacral region: Secondary | ICD-10-CM | POA: Diagnosis not present

## 2022-11-02 DIAGNOSIS — M9903 Segmental and somatic dysfunction of lumbar region: Secondary | ICD-10-CM | POA: Diagnosis not present

## 2022-12-14 DIAGNOSIS — M5386 Other specified dorsopathies, lumbar region: Secondary | ICD-10-CM | POA: Diagnosis not present

## 2022-12-14 DIAGNOSIS — M9903 Segmental and somatic dysfunction of lumbar region: Secondary | ICD-10-CM | POA: Diagnosis not present

## 2022-12-14 DIAGNOSIS — M9901 Segmental and somatic dysfunction of cervical region: Secondary | ICD-10-CM | POA: Diagnosis not present

## 2022-12-14 DIAGNOSIS — M9904 Segmental and somatic dysfunction of sacral region: Secondary | ICD-10-CM | POA: Diagnosis not present

## 2022-12-28 DIAGNOSIS — L739 Follicular disorder, unspecified: Secondary | ICD-10-CM | POA: Diagnosis not present

## 2022-12-28 DIAGNOSIS — D225 Melanocytic nevi of trunk: Secondary | ICD-10-CM | POA: Diagnosis not present

## 2022-12-28 DIAGNOSIS — L578 Other skin changes due to chronic exposure to nonionizing radiation: Secondary | ICD-10-CM | POA: Diagnosis not present

## 2022-12-28 DIAGNOSIS — L814 Other melanin hyperpigmentation: Secondary | ICD-10-CM | POA: Diagnosis not present

## 2023-01-03 ENCOUNTER — Ambulatory Visit: Payer: BC Managed Care – PPO | Admitting: Obstetrics and Gynecology

## 2023-01-18 DIAGNOSIS — M5386 Other specified dorsopathies, lumbar region: Secondary | ICD-10-CM | POA: Diagnosis not present

## 2023-01-18 DIAGNOSIS — M9904 Segmental and somatic dysfunction of sacral region: Secondary | ICD-10-CM | POA: Diagnosis not present

## 2023-01-18 DIAGNOSIS — M9903 Segmental and somatic dysfunction of lumbar region: Secondary | ICD-10-CM | POA: Diagnosis not present

## 2023-01-18 DIAGNOSIS — M9901 Segmental and somatic dysfunction of cervical region: Secondary | ICD-10-CM | POA: Diagnosis not present

## 2023-02-06 NOTE — Progress Notes (Signed)
42 y.o. G31P2012 Married [place] female here for annual exam.    Taking testosterone for decreased libido through [facility].  Started 2 weeks ago.  Her level was 21.4.  FSH 9.6.  Has Mirena and has very little bleeding.  No pain.   Having headaches weekly.  Takes Tylenol 500 mg which helps sometimes.   Does cholesterol check at work.   Taking vit D 10,000 international units.   Will see her PCP in November.   Traveling to [place] at the end of the year.   PCP: Bernadette Hoit, MD   No LMP recorded. (Menstrual status: IUD).           Sexually active: Yes.    The current method of family planning is IUD-Mirena 05/25/19.    Exercising: Yes.     Gym twice a week Smoker:  no TDap:  2016.   OB History  Gravida Para Term Preterm AB Living  3 2 2  0 1 2  SAB IAB Ectopic Multiple Live Births  1 0 0 0 2    # Outcome Date GA Lbr Len/2nd Weight Sex Type Anes PTL Lv  3 Term 05/07/14 [redacted]w[redacted]d 05:14 / 02:06 6 lb 11.1 oz (3.036 kg) M VBAC EPI  LIV  2 SAB           1 Term      CS-LTranv   LIV     Health Maintenance: Pap:   10-14-18 Neg:Neg HR HPV, 07-30-17 Neg:Neg HR HPV  History of abnormal Pap:  no MMG: scheduled next week, 02/2022 ([facility]) BI-RADS CAT 2 benign Colonoscopy:  NA BMD:  n/a  Result  n/a  HIV: neg in past Hep C: neg in past  Immunization History  Administered Date(s) Administered   HPV 9-valent 08/07/2017, 10/07/2017, 02/07/2018   Influenza,inj,Quad PF,6+ Mos 05/10/2014   Tdap 07/10/2011      reports that she has never smoked. She has never used smokeless tobacco. She reports current alcohol use of about 2.0 standard drinks of alcohol per week. She reports that she does not use drugs.  Past Medical History:  Diagnosis Date   Elevated hemoglobin A1c 2017   Level - 5.7   Gestational diabetes    Gestational diabetes    Low vitamin D level    Medical history non-contributory    Migraine headache with aura    Pregnancy induced hypertension    Status post  vacuum-assisted vaginal delivery (1/15) 05/08/2014    Past Surgical History:  Procedure Laterality Date   AUGMENTATION MAMMAPLASTY  2012   Silicone in [place]   BREAST ENHANCEMENT SURGERY     CESAREAN SECTION  01-29-09   in [place]   COSMETIC SURGERY  2012   liposuction--[place]    Current Outpatient Medications  Medication Sig Dispense Refill   eszopiclone (LUNESTA) 1 MG TABS tablet Take 1 mg by mouth at bedtime.     levocetirizine (XYZAL) 5 MG tablet Take 1 tablet (5 mg total) by mouth every evening. 30 tablet 5   levonorgestrel (MIRENA) 20 MCG/24HR IUD by Intrauterine route.     PRESCRIPTION MEDICATION Testosterone cream 2.5 mg 2 clicks behind the knee     Vitamin D, Ergocalciferol, (DRISDOL) 1.25 MG (50000 UNIT) CAPS capsule Take by mouth.     No current facility-administered medications for this visit.    Family History  Problem Relation Age of Onset   Stroke Mother    Hypertension Mother    Allergic rhinitis Mother    Migraines Mother  Cancer Father 49       Dec--metastatic CA lung   Hypertension Father    Cancer Maternal Grandfather        Dec--pancreatic CA   Asthma Neg Hx    Eczema Neg Hx    Urticaria Neg Hx    Immunodeficiency Neg Hx    Angioedema Neg Hx     Review of Systems  All other systems reviewed and are negative.   Exam:   BP 124/72 (BP Location: Left Arm, Patient Position: Sitting, Cuff Size: Normal)   Pulse 73   Ht 5' 5.5" (1.664 m)   Wt 142 lb (64.4 kg)   SpO2 98%   BMI 23.27 kg/m     General appearance: alert, cooperative and appears stated age Head: normocephalic, without obvious abnormality, atraumatic Neck: no adenopathy, supple, symmetrical, trachea midline and thyroid normal to inspection and palpation Lungs: clear to auscultation bilaterally Breasts: consistent with augmentation, no masses or tenderness, No nipple retraction or dimpling, No nipple discharge or bleeding, No axillary adenopathy Heart: regular rate and  rhythm Abdomen: soft, non-tender; no masses, no organomegaly Extremities: extremities normal, atraumatic, no cyanosis or edema Skin: skin color, texture, turgor normal. No rashes or lesions Lymph nodes: cervical, supraclavicular, and axillary nodes normal. Neurologic: grossly normal  Pelvic: External genitalia:  no lesions              No abnormal inguinal nodes palpated.              Urethra:  normal appearing urethra with no masses, tenderness or lesions              Bartholins and Skenes: normal                 Vagina: normal appearing vagina with normal color and discharge, no lesions              Cervix: no lesions.  IUD strings noted.               Pap taken: no Bimanual Exam:  Uterus:  normal size, contour, position, consistency, mobility, non-tender              Adnexa: no mass, fullness, tenderness              Rectal exam: yes.  Confirms.              Anus:  normal sphincter tone, no lesions  Chaperone was present for exam:  Warren Lacy, CMA   Assessment: Well woman with GYN exam.  Mirena IUD. Bilateral breast augmentation. Frequent headaches. Migraine with aura. Decreased libido.  On Testosterone therapy.  Increased LDL cholesterol.  Elevated A1C.  Plan: Mammogram screening discussed. Self breast awareness reviewed. Guidelines for Calcium, Vitamin D, regular exercise program including cardiovascular and weight bearing exercise. Pap and HR HPV in 2025.  Testosterone monitoring with [facility].  She will follow up with her PCP regarding her headaches.   Labs with [facility], PCP, and at work.  Fu annually and prn.

## 2023-02-07 DIAGNOSIS — E538 Deficiency of other specified B group vitamins: Secondary | ICD-10-CM | POA: Diagnosis not present

## 2023-02-07 DIAGNOSIS — E663 Overweight: Secondary | ICD-10-CM | POA: Diagnosis not present

## 2023-02-07 DIAGNOSIS — R7303 Prediabetes: Secondary | ICD-10-CM | POA: Diagnosis not present

## 2023-02-07 DIAGNOSIS — R6882 Decreased libido: Secondary | ICD-10-CM | POA: Diagnosis not present

## 2023-02-07 DIAGNOSIS — E559 Vitamin D deficiency, unspecified: Secondary | ICD-10-CM | POA: Diagnosis not present

## 2023-02-08 LAB — LAB REPORT - SCANNED: A1c: 5.5

## 2023-02-11 DIAGNOSIS — E039 Hypothyroidism, unspecified: Secondary | ICD-10-CM | POA: Diagnosis not present

## 2023-02-11 DIAGNOSIS — F419 Anxiety disorder, unspecified: Secondary | ICD-10-CM | POA: Diagnosis not present

## 2023-02-11 DIAGNOSIS — R5383 Other fatigue: Secondary | ICD-10-CM | POA: Diagnosis not present

## 2023-02-11 DIAGNOSIS — N926 Irregular menstruation, unspecified: Secondary | ICD-10-CM | POA: Diagnosis not present

## 2023-02-20 ENCOUNTER — Ambulatory Visit (INDEPENDENT_AMBULATORY_CARE_PROVIDER_SITE_OTHER): Payer: BC Managed Care – PPO | Admitting: Obstetrics and Gynecology

## 2023-02-20 ENCOUNTER — Encounter: Payer: Self-pay | Admitting: Obstetrics and Gynecology

## 2023-02-20 VITALS — BP 124/72 | HR 73 | Ht 65.5 in | Wt 142.0 lb

## 2023-02-20 DIAGNOSIS — Z01419 Encounter for gynecological examination (general) (routine) without abnormal findings: Secondary | ICD-10-CM | POA: Diagnosis not present

## 2023-02-20 NOTE — Patient Instructions (Signed)

## 2023-03-08 DIAGNOSIS — M9903 Segmental and somatic dysfunction of lumbar region: Secondary | ICD-10-CM | POA: Diagnosis not present

## 2023-03-08 DIAGNOSIS — M9904 Segmental and somatic dysfunction of sacral region: Secondary | ICD-10-CM | POA: Diagnosis not present

## 2023-03-08 DIAGNOSIS — M9901 Segmental and somatic dysfunction of cervical region: Secondary | ICD-10-CM | POA: Diagnosis not present

## 2023-03-08 DIAGNOSIS — M5386 Other specified dorsopathies, lumbar region: Secondary | ICD-10-CM | POA: Diagnosis not present

## 2023-03-14 DIAGNOSIS — Z1231 Encounter for screening mammogram for malignant neoplasm of breast: Secondary | ICD-10-CM | POA: Diagnosis not present

## 2023-03-14 DIAGNOSIS — R92333 Mammographic heterogeneous density, bilateral breasts: Secondary | ICD-10-CM | POA: Diagnosis not present

## 2023-03-14 LAB — HM MAMMOGRAPHY

## 2023-04-01 DIAGNOSIS — M9903 Segmental and somatic dysfunction of lumbar region: Secondary | ICD-10-CM | POA: Diagnosis not present

## 2023-04-01 DIAGNOSIS — M5386 Other specified dorsopathies, lumbar region: Secondary | ICD-10-CM | POA: Diagnosis not present

## 2023-04-01 DIAGNOSIS — M9901 Segmental and somatic dysfunction of cervical region: Secondary | ICD-10-CM | POA: Diagnosis not present

## 2023-04-01 DIAGNOSIS — M9904 Segmental and somatic dysfunction of sacral region: Secondary | ICD-10-CM | POA: Diagnosis not present

## 2023-05-10 DIAGNOSIS — M9901 Segmental and somatic dysfunction of cervical region: Secondary | ICD-10-CM | POA: Diagnosis not present

## 2023-05-10 DIAGNOSIS — M9903 Segmental and somatic dysfunction of lumbar region: Secondary | ICD-10-CM | POA: Diagnosis not present

## 2023-05-10 DIAGNOSIS — M5386 Other specified dorsopathies, lumbar region: Secondary | ICD-10-CM | POA: Diagnosis not present

## 2023-05-10 DIAGNOSIS — M9904 Segmental and somatic dysfunction of sacral region: Secondary | ICD-10-CM | POA: Diagnosis not present

## 2023-06-21 DIAGNOSIS — M9903 Segmental and somatic dysfunction of lumbar region: Secondary | ICD-10-CM | POA: Diagnosis not present

## 2023-06-21 DIAGNOSIS — M9904 Segmental and somatic dysfunction of sacral region: Secondary | ICD-10-CM | POA: Diagnosis not present

## 2023-06-21 DIAGNOSIS — M9901 Segmental and somatic dysfunction of cervical region: Secondary | ICD-10-CM | POA: Diagnosis not present

## 2023-06-21 DIAGNOSIS — M5386 Other specified dorsopathies, lumbar region: Secondary | ICD-10-CM | POA: Diagnosis not present

## 2023-10-01 DIAGNOSIS — S63591A Other specified sprain of right wrist, initial encounter: Secondary | ICD-10-CM | POA: Diagnosis not present

## 2023-10-01 DIAGNOSIS — E538 Deficiency of other specified B group vitamins: Secondary | ICD-10-CM | POA: Diagnosis not present

## 2023-10-01 DIAGNOSIS — E663 Overweight: Secondary | ICD-10-CM | POA: Diagnosis not present

## 2023-10-01 DIAGNOSIS — E559 Vitamin D deficiency, unspecified: Secondary | ICD-10-CM | POA: Diagnosis not present

## 2023-10-01 DIAGNOSIS — R7303 Prediabetes: Secondary | ICD-10-CM | POA: Diagnosis not present

## 2023-10-01 DIAGNOSIS — R6882 Decreased libido: Secondary | ICD-10-CM | POA: Diagnosis not present

## 2023-10-01 DIAGNOSIS — M25531 Pain in right wrist: Secondary | ICD-10-CM | POA: Diagnosis not present

## 2023-10-05 LAB — LAB REPORT - SCANNED
A1c: 5.6
EGFR (Non-African Amer.): 92

## 2023-10-09 DIAGNOSIS — R5383 Other fatigue: Secondary | ICD-10-CM | POA: Diagnosis not present

## 2023-10-09 DIAGNOSIS — E039 Hypothyroidism, unspecified: Secondary | ICD-10-CM | POA: Diagnosis not present

## 2023-10-09 DIAGNOSIS — N926 Irregular menstruation, unspecified: Secondary | ICD-10-CM | POA: Diagnosis not present

## 2023-10-09 DIAGNOSIS — F419 Anxiety disorder, unspecified: Secondary | ICD-10-CM | POA: Diagnosis not present

## 2023-10-15 ENCOUNTER — Ambulatory Visit: Admitting: Family Medicine

## 2023-10-15 ENCOUNTER — Encounter: Payer: Self-pay | Admitting: Family Medicine

## 2023-10-15 ENCOUNTER — Encounter: Payer: Self-pay | Admitting: Obstetrics and Gynecology

## 2023-10-15 VITALS — BP 120/76 | HR 74 | Temp 98.1°F | Ht 65.0 in | Wt 148.0 lb

## 2023-10-15 DIAGNOSIS — Z793 Long term (current) use of hormonal contraceptives: Secondary | ICD-10-CM | POA: Diagnosis not present

## 2023-10-15 DIAGNOSIS — Z7689 Persons encountering health services in other specified circumstances: Secondary | ICD-10-CM

## 2023-10-15 DIAGNOSIS — Z823 Family history of stroke: Secondary | ICD-10-CM | POA: Diagnosis not present

## 2023-10-15 DIAGNOSIS — Z975 Presence of (intrauterine) contraceptive device: Secondary | ICD-10-CM

## 2023-10-15 DIAGNOSIS — Z0000 Encounter for general adult medical examination without abnormal findings: Secondary | ICD-10-CM

## 2023-10-15 DIAGNOSIS — Z13220 Encounter for screening for lipoid disorders: Secondary | ICD-10-CM | POA: Diagnosis not present

## 2023-10-15 DIAGNOSIS — Z131 Encounter for screening for diabetes mellitus: Secondary | ICD-10-CM

## 2023-10-15 LAB — LIPID PANEL
Cholesterol: 238 mg/dL — ABNORMAL HIGH (ref 0–200)
HDL: 58.2 mg/dL (ref >39.00)
LDL Cholesterol: 164 mg/dL — ABNORMAL HIGH (ref 0–99)
NonHDL: 179.44
Total CHOL/HDL Ratio: 4
Triglycerides: 76 mg/dL (ref 0.0–149.0)
VLDL: 15.2 mg/dL (ref 0.0–40.0)

## 2023-10-15 LAB — TSH: TSH: 1.23 u[IU]/mL (ref 0.35–5.50)

## 2023-10-15 LAB — T4, FREE: Free T4: 0.88 ng/dL (ref 0.60–1.60)

## 2023-10-15 NOTE — Progress Notes (Signed)
 Patient ID: Christina Black, female  DOB: 07-21-1980, 43 y.o.   MRN: [id number] Patient Care Team    Relationship Specialty Notifications Start End  Catherine Charlies LABOR, DO PCP - General Family Medicine  10/15/23   Cathlyn JAYSON Nikki Bobie FORBES, MD Consulting Physician Obstetrics and Gynecology  10/28/19     Chief Complaint  Patient presents with   Establish Care    .    Annual Exam   Patient is fasting Subjective:  Christina Black is a 43 y.o.  female present for new patient establishment. All past medical history, surgical history, allergies, family history, immunizations, medications and social history were updated in the electronic medical record today. All recent labs, ED visits and hospitalizations within the last year were reviewed.  Health maintenance:  Colonoscopy: no fhx, screen at 45 Mammogram: no fhx, completed 2024 at GYN Cervical cancer screening: last pap: 2024, completed by: gyn Immunizations: tdap 10/16/2021 UTD, Influenza (encouraged yearly) Infectious disease screening: HIV and  Hep C completed DEXA: routine screen Patient has a Dental home. Hospitalizations/ED visits:reviewed  Reviewed [facility] labs 10/01/2023 CBC with differential-WBC 7.0, hemoglobin 13.9, hematocrit 41.4, platelets 275 BMP glucose 77, creatinine 0.82, sodium 138, potassium 4.2, chloride 100 calcium 10.0, albumin 4.3, protein 6.8, GFR 92 LFT-total bili 0.3, alk phos 52, AST 17, ALT 14 Hemoglobin A1c-5.6 DHEA-177 Testosterone 19.9 Vitamin D  51.5 Leptin 32.7 B12 344       10/15/2023   10:41 AM  Depression screen PHQ 2/9  Decreased Interest 0  Down, Depressed, Hopeless 0  PHQ - 2 Score 0  Altered sleeping 1  Tired, decreased energy 2  Change in appetite 0  Feeling bad or failure about yourself  0  Trouble concentrating 0  Moving slowly or fidgety/restless 0  Suicidal thoughts 0  PHQ-9 Score 3  Difficult doing work/chores Not difficult at all      10/15/2023   10:42 AM   GAD 7 : Generalized Anxiety Score  Nervous, Anxious, on Edge 0  Control/stop worrying 0  Worry too much - different things 0  Trouble relaxing 0  Restless 0  Easily annoyed or irritable 0  Afraid - awful might happen 0  Total GAD 7 Score 0  Anxiety Difficulty Not difficult at all             10/15/2023   10:41 AM  Fall Risk   Falls in the past year? 0  Follow up Falls evaluation completed     Immunization History  Administered Date(s) Administered   HPV 9-valent 08/07/2017, 10/07/2017, 02/07/2018   Influenza,inj,Quad PF,6+ Mos 05/10/2014   Influenza-Unspecified 05/10/2014, 02/25/2018   Tdap 07/10/2011, 10/16/2021    No results found.  Past Medical History:  Diagnosis Date   Chicken pox    Dependent edema 05/10/2014   Elevated hemoglobin A1c 2017   Level - 5.7   Gestational diabetes    Gestational hypertension without significant proteinuria in third trimester 05/07/2014   Low vitamin D  level    Maternal care for scar from previous cesarean delivery 05/10/2014   Migraine headache with aura    Pregnancy induced hypertension    Status post vacuum-assisted vaginal delivery (1/15) 05/08/2014   Tenosynovitis, de Quervain 11/13/2016   VBAC, delivered, current hospitalization 05/10/2014   Allergies  Allergen Reactions   Cefazolin Anaphylaxis and Hives   Past Surgical History:  Procedure Laterality Date   AUGMENTATION MAMMAPLASTY  2012   Silicone in [place]   CESAREAN SECTION  01/29/2009  in [place]   COSMETIC SURGERY  2012   liposuction--[place]   Family History  Problem Relation Age of Onset   Stroke Mother    Hypertension Mother    Allergic rhinitis Mother    Migraines Mother    Cancer Father 31       Dec--metastatic CA lung   Hypertension Father    Cancer Maternal Grandfather        Dec--pancreatic CA   Asthma Neg Hx    Eczema Neg Hx    Urticaria Neg Hx    Immunodeficiency Neg Hx    Angioedema Neg Hx    Social History   Social History  Narrative   Marital status/children/pets: married, 2 children   Education/employment: B.A. degree- Engineer, technical sales:      -smoke alarm in the home:Yes     - wears seatbelt: Yes     - Feels safe in their relationships: Yes       Allergies as of 10/15/2023       Reactions   Cefazolin Anaphylaxis, Hives        Medication List        Accurate as of October 15, 2023 11:41 AM. If you have any questions, ask your nurse or doctor.          STOP taking these medications    eszopiclone 1 MG Tabs tablet Commonly known as: LUNESTA Stopped by: Charlies Bellini   levocetirizine 5 MG tablet Commonly known as: XYZAL  Stopped by: Charlies Bellini   meloxicam 7.5 MG tablet Commonly known as: MOBIC Stopped by: Charlies Bellini   PRESCRIPTION MEDICATION Stopped by: Charlies Bellini       TAKE these medications    levonorgestrel  20 MCG/24HR IUD Commonly known as: MIRENA  by Intrauterine route.   Vitamin D  (Ergocalciferol ) 1.25 MG (50000 UNIT) Caps capsule Commonly known as: DRISDOL  Take by mouth.        All past medical history, surgical history, allergies, family history, immunizations andmedications were updated in the EMR today and reviewed under the history and medication portions of their EMR.    No results found for this or any previous visit (from the past 2160 hours).   ROS 14 pt review of systems performed and negative (unless mentioned in an HPI)  Objective:  BP 120/76   Pulse 74   Temp 98.1 F (36.7 C)   Ht 5' 5 (1.651 m)   Wt 148 lb (67.1 kg)   SpO2 100%   BMI 24.63 kg/m   Physical Exam Vitals and nursing note reviewed.  Constitutional:      General: She is not in acute distress.    Appearance: Normal appearance. She is not ill-appearing or toxic-appearing.  HENT:     Head: Normocephalic and atraumatic.     Right Ear: Tympanic membrane, ear canal and external ear normal. There is no impacted cerumen.     Left Ear: Tympanic membrane, ear canal and external  ear normal. There is no impacted cerumen.     Nose: No congestion or rhinorrhea.     Mouth/Throat:     Mouth: Mucous membranes are moist.     Pharynx: Oropharynx is clear. No oropharyngeal exudate or posterior oropharyngeal erythema.   Eyes:     General: No scleral icterus.       Right eye: No discharge.        Left eye: No discharge.     Extraocular Movements: Extraocular movements intact.     Conjunctiva/sclera: Conjunctivae normal.  Pupils: Pupils are equal, round, and reactive to light.    Cardiovascular:     Rate and Rhythm: Normal rate and regular rhythm.     Pulses: Normal pulses.     Heart sounds: Normal heart sounds. No murmur heard.    No friction rub. No gallop.  Pulmonary:     Effort: Pulmonary effort is normal. No respiratory distress.     Breath sounds: Normal breath sounds. No stridor. No wheezing, rhonchi or rales.  Chest:     Chest wall: No tenderness.  Abdominal:     General: Abdomen is flat. Bowel sounds are normal. There is no distension.     Palpations: Abdomen is soft. There is no mass.     Tenderness: There is no abdominal tenderness. There is no right CVA tenderness, left CVA tenderness, guarding or rebound.     Hernia: No hernia is present.   Musculoskeletal:        General: No swelling, tenderness or deformity. Normal range of motion.     Cervical back: Normal range of motion and neck supple. No rigidity or tenderness.     Right lower leg: No edema.     Left lower leg: No edema.  Lymphadenopathy:     Cervical: No cervical adenopathy.   Skin:    General: Skin is warm and dry.     Coloration: Skin is not jaundiced or pale.     Findings: No bruising, erythema, lesion or rash.   Neurological:     General: No focal deficit present.     Mental Status: She is alert and oriented to person, place, and time. Mental status is at baseline.     Cranial Nerves: No cranial nerve deficit.     Sensory: No sensory deficit.     Motor: No weakness.      Coordination: Coordination normal.     Gait: Gait normal.     Deep Tendon Reflexes: Reflexes normal.   Psychiatric:        Mood and Affect: Mood normal.        Behavior: Behavior normal.        Thought Content: Thought content normal.        Judgment: Judgment normal.      Assessment/plan: MAYSEN BONSIGNORE is a 43 y.o. female present for est care/cpe Establishing care with new doctor, encounter for Long term current use of hormonal contraceptive/ IUD (intrauterine device) in place - TSH - T4, free - Lipid panel  Family history of stroke/ Lipid screening Discuss heart/brain healthy diet BP is great A1c was normal 09/2023 - Lipid panel  Routine general medical examination at a health care facility (Primary) Colonoscopy: no fhx, screen at 45 Mammogram: no fhx, completed 2024 at GYN Cervical cancer screening: last pap: 2024, completed by: gyn Immunizations: tdap 10/16/2021 UTD, Influenza (encouraged yearly) Infectious disease screening: HIV and  Hep C completed DEXA: routine screen Patient was encouraged to exercise greater than 150 minutes a week. Patient was encouraged to choose a diet filled with fresh fruits and vegetables, and lean meats. AVS provided to patient today for education/recommendation on gender specific health and safety maintenance.  Return in about 1 year (around 10/15/2024) for cpe (20 min).  Orders Placed This Encounter  Procedures   TSH   T4, free   Lipid panel   No orders of the defined types were placed in this encounter.  Referral Orders  No referral(s) requested today     Note is dictated utilizing voice  recognition software. Although note has been proof read prior to signing, occasional typographical errors still can be missed. If any questions arise, please do not hesitate to call for verification.  Electronically signed by: Charlies Bellini, DO [facility]

## 2023-10-15 NOTE — Addendum Note (Signed)
 Addended by: VICCI ARVIN PARAS on: 10/15/2023 03:48 PM   Modules accepted: Orders

## 2023-10-15 NOTE — Patient Instructions (Addendum)

## 2023-10-16 ENCOUNTER — Ambulatory Visit: Payer: Self-pay | Admitting: Family Medicine

## 2023-10-16 DIAGNOSIS — E7800 Pure hypercholesterolemia, unspecified: Secondary | ICD-10-CM | POA: Insufficient documentation

## 2023-10-28 ENCOUNTER — Encounter: Payer: Self-pay | Admitting: Family Medicine

## 2024-02-07 DIAGNOSIS — M79671 Pain in right foot: Secondary | ICD-10-CM | POA: Diagnosis not present

## 2024-02-07 DIAGNOSIS — M2012 Hallux valgus (acquired), left foot: Secondary | ICD-10-CM | POA: Diagnosis not present

## 2024-02-07 DIAGNOSIS — M79672 Pain in left foot: Secondary | ICD-10-CM | POA: Diagnosis not present

## 2024-02-07 DIAGNOSIS — M2011 Hallux valgus (acquired), right foot: Secondary | ICD-10-CM | POA: Diagnosis not present

## 2024-02-07 DIAGNOSIS — M21622 Bunionette of left foot: Secondary | ICD-10-CM | POA: Diagnosis not present

## 2024-03-03 DIAGNOSIS — Z9882 Breast implant status: Secondary | ICD-10-CM | POA: Diagnosis not present

## 2024-03-03 DIAGNOSIS — R92333 Mammographic heterogeneous density, bilateral breasts: Secondary | ICD-10-CM | POA: Diagnosis not present

## 2024-03-03 DIAGNOSIS — Z1231 Encounter for screening mammogram for malignant neoplasm of breast: Secondary | ICD-10-CM | POA: Diagnosis not present

## 2024-03-10 ENCOUNTER — Ambulatory Visit: Admitting: Obstetrics and Gynecology

## 2024-03-10 ENCOUNTER — Other Ambulatory Visit (HOSPITAL_COMMUNITY)
Admission: RE | Admit: 2024-03-10 | Discharge: 2024-03-10 | Disposition: A | Discharge status: Home / Self Care | Source: Ambulatory Visit | Attending: Obstetrics and Gynecology | Admitting: Obstetrics and Gynecology

## 2024-03-10 ENCOUNTER — Encounter: Payer: Self-pay | Admitting: Obstetrics and Gynecology

## 2024-03-10 VITALS — BP 110/76 | HR 87 | Ht 65.0 in | Wt 144.0 lb

## 2024-03-10 DIAGNOSIS — Z8632 Personal history of gestational diabetes: Secondary | ICD-10-CM

## 2024-03-10 DIAGNOSIS — N951 Menopausal and female climacteric states: Secondary | ICD-10-CM | POA: Diagnosis not present

## 2024-03-10 DIAGNOSIS — Z01419 Encounter for gynecological examination (general) (routine) without abnormal findings: Secondary | ICD-10-CM

## 2024-03-10 DIAGNOSIS — Z124 Encounter for screening for malignant neoplasm of cervix: Secondary | ICD-10-CM

## 2024-03-10 DIAGNOSIS — Z1331 Encounter for screening for depression: Secondary | ICD-10-CM | POA: Diagnosis not present

## 2024-03-10 DIAGNOSIS — R6882 Decreased libido: Secondary | ICD-10-CM

## 2024-03-10 DIAGNOSIS — R5383 Other fatigue: Secondary | ICD-10-CM

## 2024-03-10 NOTE — Patient Instructions (Signed)

## 2024-03-10 NOTE — Progress Notes (Unsigned)
 43 y.o. G70P2012 Married Hispanic female here for annual exam. Thinks she is in perimenopause and feels something on Right inferior breast. Causing her pain, first noticed about 1 month ago, before she had her mammogram done.    Having fatigue, foggy memory, decreased libido (not using testosterone ), hot flashes.    Wants a series of blood tests to check her symptoms today.   We choose these together.    More frequent headaches, every week.    Has had postcoital brown discharge a few days after intercourse.   She notes some discomfort/dryness with relations.   Not usually having periods with her Mirena  IUD.   PCP: Catherine Fuller A, DO   No LMP recorded. (Menstrual status: IUD).           Sexually active: Yes.    The current method of family planning is IUD Mirena  inserted 05-26-19.    Menopausal hormone therapy:  n/a Exercising: Yes.    Walking and gym  Smoker:  no  OB History  Gravida Para Term Preterm AB Living  3 2 2  0 1 2  SAB IAB Ectopic Multiple Live Births  1 0 0 0 2    # Outcome Date GA Lbr Len/2nd Weight Sex Type Anes PTL Lv  3 Term 05/07/14 [redacted]w[redacted]d 05:14 / 02:06 6 lb 11.1 oz (3.036 kg) M VBAC EPI  LIV  2 SAB           1 Term      CS-LTranv   LIV     HEALTH MAINTENANCE: Last 2 paps:  10/14/18 neg HPV neg, 07/30/17 neg, HPV neg  History of abnormal Pap or positive HPV:  no Mammogram:   03/03/24 [facility] Cat 2 benign (care everywhere) - mobile unit.   Colonoscopy:  n/a Bone Density:  n/a  Result  n/a   Immunization History  Administered Date(s) Administered   HPV 9-valent 08/07/2017, 10/07/2017, 02/07/2018   Influenza,inj,Quad PF,6+ Mos 05/10/2014   Influenza-Unspecified 05/10/2014, 02/25/2018   Tdap 07/10/2011, 10/16/2021      reports that she has never smoked. She has never been exposed to tobacco smoke. She has never used smokeless tobacco. She reports current alcohol use of about 2.0 standard drinks of alcohol per week. She reports that she does not use  drugs.  Past Medical History:  Diagnosis Date   Chicken pox    Dependent edema 05/10/2014   Elevated hemoglobin A1c 2017   Level - 5.7   Gestational diabetes    Gestational hypertension without significant proteinuria in third trimester 05/07/2014   Low vitamin D  level    Maternal care for scar from previous cesarean delivery 05/10/2014   Migraine headache with aura    Pregnancy induced hypertension    Status post vacuum-assisted vaginal delivery (1/15) 05/08/2014   Tenosynovitis, de Quervain 11/13/2016   VBAC, delivered, current hospitalization 05/10/2014    Past Surgical History:  Procedure Laterality Date   AUGMENTATION MAMMAPLASTY  2012   Silicone in [place]   CESAREAN SECTION  01/29/2009   in [place]   COSMETIC SURGERY  2012   liposuction--[place]   INTRAUTERINE DEVICE INSERTION     mirena  iud inserted 05-25-19    Current Outpatient Medications  Medication Sig Dispense Refill   levonorgestrel  (MIRENA ) 20 MCG/24HR IUD by Intrauterine route.     Vitamin D , Ergocalciferol , (DRISDOL ) 1.25 MG (50000 UNIT) CAPS capsule Take by mouth.     No current facility-administered medications for this visit.    ALLERGIES: Cefazolin  Family  History  Problem Relation Age of Onset   Stroke Mother    Hypertension Mother    Allergic rhinitis Mother    Migraines Mother    Cancer Father 6       Dec--metastatic CA lung   Hypertension Father    Cancer Maternal Grandfather        Dec--pancreatic CA   Asthma Neg Hx    Eczema Neg Hx    Urticaria Neg Hx    Immunodeficiency Neg Hx    Angioedema Neg Hx     Review of Systems  All other systems reviewed and are negative.   PHYSICAL EXAM:  BP 110/76 (BP Location: Left Arm, Patient Position: Sitting)   Pulse 87   Ht 5' 5 (1.651 m)   Wt 144 lb (65.3 kg)   SpO2 98%   BMI 23.96 kg/m     General appearance: alert, cooperative and appears stated age Head: normocephalic, without obvious abnormality, atraumatic Neck: no adenopathy,  supple, symmetrical, trachea midline and thyroid  normal to inspection and palpation Lungs: clear to auscultation bilaterally Breasts: right - implant present, no masses, tenderness along inferior midline of breast,  No nipple retraction or dimpling, No nipple discharge or bleeding, No axillary adenopathy Left - implant present, no masses or tenderness, No nipple retraction or dimpling, No nipple discharge or bleeding, No axillary adenopathy Heart: regular rate and rhythm Abdomen: soft, non-tender; no masses, no organomegaly Extremities: extremities normal, atraumatic, no cyanosis or edema Skin: skin color, texture, turgor normal. No rashes or lesions Lymph nodes: cervical, supraclavicular, and axillary nodes normal. Neurologic: grossly normal  Pelvic: External genitalia:  no lesions              No abnormal inguinal nodes palpated.              Urethra:  normal appearing urethra with no masses, tenderness or lesions              Bartholins and Skenes: normal                 Vagina: normal appearing vagina with normal color and discharge, no lesions              Cervix: no lesions.   IUD strings noted.              Pap taken: yes. Bimanual Exam:  Uterus:  normal size, contour, position, consistency, mobility, non-tender              Adnexa: no mass, fullness, tenderness              Rectal exam: yes.  Confirms.              Anus:  normal sphincter tone, no lesions  Chaperone was present for exam:  Kari HERO, CMA  ASSESSMENT: Well woman visit with gynecologic exam. Mirena  IUD. Cervical cancer screening.  Post coital spotting.  Bilateral breast augmentation. Right breast pain. Frequent headaches. Migraine with aura. Decreased libido.  On Testosterone  therapy.  Fatigue.   Increased LDL cholesterol.  History of gestational diabetes.   PHQ-2-9: 0  PLAN: Mammogram screening discussed. Diagnostic right mammogram and right breast US .  Self breast awareness reviewed. Pap and HRV  collected:  yes Guidelines for Calcium, Vitamin D , regular exercise program including cardiovascular and weight bearing exercise. Medication refills:  NA Lubricant for sex.  If postcoital spotting continues, will order pelvic ultrasound.  FSH, estradiol , testosterone , TSH, CMP, A1C, vit B12, vit D, CBC, iron , ferritin.  I recommend she see her PCP regarding her headaches.   Follow up:  yearly and prn.

## 2024-03-11 ENCOUNTER — Telehealth: Payer: Self-pay | Admitting: Obstetrics and Gynecology

## 2024-03-11 DIAGNOSIS — N644 Mastodynia: Secondary | ICD-10-CM

## 2024-03-11 LAB — CYTOLOGY - PAP
Comment: NEGATIVE
Diagnosis: NEGATIVE
High risk HPV: NEGATIVE

## 2024-03-11 NOTE — Telephone Encounter (Signed)
 Please add vitamin B12 and vit D to labs drawn yesterday.

## 2024-03-11 NOTE — Telephone Encounter (Signed)
 Please schedule dx right mammogram and right breast ultrasound at the [facility].   Patient has breast implants and right breast pain at about 6:00 position.  No mass noted.   She had screening breast imaging done at [facility] on 03/03/24.

## 2024-03-11 NOTE — Telephone Encounter (Signed)
 Spoke with Ebony at [facility]. Patient will need to call to schedule, will need previous images prior to scheduling.   Spoke with patient, now not a good time, she will call back after 1030.

## 2024-03-11 NOTE — Telephone Encounter (Signed)
 Labs were already collected for Vit B12 and vit D.

## 2024-03-12 NOTE — Telephone Encounter (Signed)
 Per review of EPIC, patient is scheduled for Dx MMG at [facility] on 03/26/24.   Encounter closed.

## 2024-03-13 ENCOUNTER — Ambulatory Visit: Payer: Self-pay | Admitting: Obstetrics and Gynecology

## 2024-03-14 LAB — COMPREHENSIVE METABOLIC PANEL WITH GFR
AG Ratio: 2 (calc) (ref 1.0–2.5)
ALT: 16 U/L (ref 6–29)
AST: 21 U/L (ref 10–30)
Albumin: 4.7 g/dL (ref 3.6–5.1)
Alkaline phosphatase (APISO): 37 U/L (ref 31–125)
BUN: 11 mg/dL (ref 7–25)
CO2: 24 mmol/L (ref 20–32)
Calcium: 9.6 mg/dL (ref 8.6–10.2)
Chloride: 103 mmol/L (ref 98–110)
Creat: 0.68 mg/dL (ref 0.50–0.99)
Globulin: 2.4 g/dL (ref 1.9–3.7)
Glucose, Bld: 80 mg/dL (ref 65–99)
Potassium: 3.7 mmol/L (ref 3.5–5.3)
Sodium: 138 mmol/L (ref 135–146)
Total Bilirubin: 0.5 mg/dL (ref 0.2–1.2)
Total Protein: 7.1 g/dL (ref 6.1–8.1)
eGFR: 111 mL/min/1.73m2 (ref >=60)

## 2024-03-14 LAB — CBC
HCT: 39.6 % (ref 35.0–45.0)
Hemoglobin: 13.3 g/dL (ref 11.7–15.5)
MCH: 31.2 pg (ref 27.0–33.0)
MCHC: 33.6 g/dL (ref 32.0–36.0)
MCV: 93 fL (ref 80.0–100.0)
MPV: 10.8 fL (ref 7.5–12.5)
Platelets: 277 Thousand/uL (ref 140–400)
RBC: 4.26 Million/uL (ref 3.80–5.10)
RDW: 12.2 % (ref 11.0–15.0)
WBC: 9 Thousand/uL (ref 3.8–10.8)

## 2024-03-14 LAB — VITAMIN D 25 HYDROXY (VIT D DEFICIENCY, FRACTURES): Vit D, 25-Hydroxy: 31 ng/mL (ref 30–100)

## 2024-03-14 LAB — IRON: Iron: 65 ug/dL (ref 40–190)

## 2024-03-14 LAB — VITAMIN B12: Vitamin B-12: 323 pg/mL (ref 200–1100)

## 2024-03-14 LAB — FERRITIN: Ferritin: 165 ng/mL (ref 16–232)

## 2024-03-14 LAB — FOLLICLE STIMULATING HORMONE: FSH: 3.7 m[IU]/mL

## 2024-03-14 LAB — TESTOS,TOTAL,FREE AND SHBG (FEMALE)
Free Testosterone: 1.8 pg/mL (ref 0.1–6.4)
Sex Hormone Binding: 37 nmol/L (ref 17–124)
Testosterone, Total, LC-MS-MS: 15 ng/dL (ref 2–45)

## 2024-03-14 LAB — HEMOGLOBIN A1C
Hgb A1c MFr Bld: 5.4 % (ref <5.7)
Mean Plasma Glucose: 108 mg/dL
eAG (mmol/L): 6 mmol/L

## 2024-03-14 LAB — ESTRADIOL: Estradiol: 49 pg/mL

## 2024-03-14 LAB — TSH: TSH: 1.05 m[IU]/L

## 2024-03-26 ENCOUNTER — Ambulatory Visit: Payer: Self-pay | Admitting: Obstetrics and Gynecology

## 2024-03-26 ENCOUNTER — Encounter: Payer: Self-pay | Admitting: Family Medicine

## 2024-03-26 ENCOUNTER — Ambulatory Visit
Admission: RE | Admit: 2024-03-26 | Discharge: 2024-03-26 | Disposition: A | Source: Ambulatory Visit | Attending: Obstetrics and Gynecology | Admitting: Obstetrics and Gynecology

## 2024-03-26 DIAGNOSIS — N644 Mastodynia: Secondary | ICD-10-CM

## 2024-03-26 DIAGNOSIS — R928 Other abnormal and inconclusive findings on diagnostic imaging of breast: Secondary | ICD-10-CM | POA: Diagnosis not present

## 2024-03-26 DIAGNOSIS — N6489 Other specified disorders of breast: Secondary | ICD-10-CM | POA: Diagnosis not present

## 2024-03-27 NOTE — Telephone Encounter (Signed)
 May provide note stating patient is of good health he can have access to the pool.

## 2024-05-28 ENCOUNTER — Other Ambulatory Visit: Payer: Self-pay | Admitting: Medical Genetics

## 2024-10-15 ENCOUNTER — Encounter: Admitting: Family Medicine
